# Patient Record
Sex: Female | Born: 1965 | Race: White | Hispanic: No | Smoking: Current every day smoker
Health system: Southern US, Community
[De-identification: ages and names within clinical notes are randomized; demographics above are authoritative.]

## PROBLEM LIST (undated history)

## (undated) DIAGNOSIS — R569 Unspecified convulsions: Secondary | ICD-10-CM

## (undated) DIAGNOSIS — Z8673 Personal history of transient ischemic attack (TIA), and cerebral infarction without residual deficits: Secondary | ICD-10-CM

## (undated) DIAGNOSIS — J449 Chronic obstructive pulmonary disease, unspecified: Secondary | ICD-10-CM

## (undated) DIAGNOSIS — G4733 Obstructive sleep apnea (adult) (pediatric): Secondary | ICD-10-CM

## (undated) DIAGNOSIS — C189 Malignant neoplasm of colon, unspecified: Secondary | ICD-10-CM

## (undated) DIAGNOSIS — C73 Malignant neoplasm of thyroid gland: Secondary | ICD-10-CM

## (undated) DIAGNOSIS — E119 Type 2 diabetes mellitus without complications: Secondary | ICD-10-CM

## (undated) DIAGNOSIS — G510 Bell's palsy: Secondary | ICD-10-CM

## (undated) DIAGNOSIS — G35 Multiple sclerosis: Secondary | ICD-10-CM

## (undated) DIAGNOSIS — Z973 Presence of spectacles and contact lenses: Secondary | ICD-10-CM

## (undated) DIAGNOSIS — I639 Cerebral infarction, unspecified: Secondary | ICD-10-CM

## (undated) DIAGNOSIS — I10 Essential (primary) hypertension: Secondary | ICD-10-CM

## (undated) DIAGNOSIS — F419 Anxiety disorder, unspecified: Secondary | ICD-10-CM

## (undated) DIAGNOSIS — E079 Disorder of thyroid, unspecified: Secondary | ICD-10-CM

## (undated) DIAGNOSIS — Z808 Family history of malignant neoplasm of other organs or systems: Secondary | ICD-10-CM

## (undated) DIAGNOSIS — D689 Coagulation defect, unspecified: Secondary | ICD-10-CM

## (undated) DIAGNOSIS — G709 Myoneural disorder, unspecified: Secondary | ICD-10-CM

## (undated) DIAGNOSIS — C801 Malignant (primary) neoplasm, unspecified: Secondary | ICD-10-CM

## (undated) DIAGNOSIS — M1990 Unspecified osteoarthritis, unspecified site: Secondary | ICD-10-CM

## (undated) DIAGNOSIS — F329 Major depressive disorder, single episode, unspecified: Secondary | ICD-10-CM

## (undated) DIAGNOSIS — K219 Gastro-esophageal reflux disease without esophagitis: Secondary | ICD-10-CM

## (undated) DIAGNOSIS — F32A Depression, unspecified: Secondary | ICD-10-CM

## (undated) DIAGNOSIS — D6801 Von willebrand disease, type 1: Secondary | ICD-10-CM

## (undated) DIAGNOSIS — G4730 Sleep apnea, unspecified: Secondary | ICD-10-CM

## (undated) DIAGNOSIS — R0902 Hypoxemia: Secondary | ICD-10-CM

## (undated) DIAGNOSIS — D680 Von Willebrand's disease: Secondary | ICD-10-CM

## (undated) DIAGNOSIS — J45909 Unspecified asthma, uncomplicated: Secondary | ICD-10-CM

## (undated) DIAGNOSIS — E785 Hyperlipidemia, unspecified: Secondary | ICD-10-CM

## (undated) DIAGNOSIS — Z806 Family history of leukemia: Secondary | ICD-10-CM

## (undated) DIAGNOSIS — Z8501 Personal history of malignant neoplasm of esophagus: Secondary | ICD-10-CM

## (undated) DIAGNOSIS — Z807 Family history of other malignant neoplasms of lymphoid, hematopoietic and related tissues: Secondary | ICD-10-CM

## (undated) HISTORY — PX: COLON SURGERY: SHX602

## (undated) HISTORY — PX: HX APPENDECTOMY: SHX54

## (undated) HISTORY — DX: Obstructive sleep apnea (adult) (pediatric): G47.33

## (undated) HISTORY — DX: Personal history of transient ischemic attack (TIA), and cerebral infarction without residual deficits: Z86.73

## (undated) HISTORY — DX: Malignant neoplasm of thyroid gland (CMS HCC): C73

## (undated) HISTORY — DX: Essential (primary) hypertension: I10

## (undated) HISTORY — DX: Chronic obstructive pulmonary disease, unspecified (CMS HCC): J44.9

## (undated) HISTORY — DX: Type 2 diabetes mellitus without complications (CMS HCC): E11.9

## (undated) HISTORY — PX: HX THYROIDECTOMY: SHX17

## (undated) HISTORY — DX: Unspecified convulsions (CMS HCC): R56.9

## (undated) HISTORY — DX: Chronic obstructive pulmonary disease, unspecified: J44.9

## (undated) HISTORY — DX: Anxiety disorder, unspecified: F41.9

## (undated) HISTORY — DX: Depression, unspecified: F32.A

## (undated) HISTORY — PX: APPENDECTOMY: SHX54

## (undated) HISTORY — DX: Personal history of malignant neoplasm of esophagus: Z85.01

## (undated) HISTORY — DX: Disorder of thyroid, unspecified: E07.9

## (undated) HISTORY — DX: Coagulation defect, unspecified: D68.9

## (undated) HISTORY — DX: Myoneural disorder, unspecified: G70.9

## (undated) HISTORY — PX: OTHER SURGICAL HISTORY: SHX169

## (undated) HISTORY — PX: HERNIA REPAIR: SHX51

## (undated) HISTORY — DX: Unspecified osteoarthritis, unspecified site: M19.90

## (undated) HISTORY — DX: Unspecified asthma, uncomplicated: J45.909

## (undated) HISTORY — DX: Hypoxemia: R09.02

## (undated) HISTORY — DX: Sleep apnea, unspecified: G47.30

## (undated) HISTORY — DX: Family history of malignant neoplasm of other organs or systems: Z80.8

## (undated) HISTORY — DX: Malignant (primary) neoplasm, unspecified: C80.1

## (undated) HISTORY — DX: Hyperlipidemia, unspecified: E78.5

## (undated) HISTORY — DX: Type 2 diabetes mellitus without complications: E11.9

## (undated) HISTORY — DX: Unspecified convulsions: R56.9

## (undated) HISTORY — DX: Family history of leukemia: Z80.6

## (undated) HISTORY — DX: Gastro-esophageal reflux disease without esophagitis: K21.9

## (undated) HISTORY — PX: ABDOMINAL HYSTERECTOMY: SHX81

## (undated) HISTORY — DX: Family history of other malignant neoplasms of lymphoid, hematopoietic and related tissues: Z80.7

## (undated) HISTORY — PX: KNEE SURGERY: SHX244

---
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
## 2002-02-16 DIAGNOSIS — C181 Malignant neoplasm of appendix: Secondary | ICD-10-CM

## 2002-02-16 HISTORY — DX: Malignant neoplasm of appendix: C18.1

## 2007-02-17 DIAGNOSIS — C189 Malignant neoplasm of colon, unspecified: Secondary | ICD-10-CM

## 2007-02-17 HISTORY — DX: Malignant neoplasm of colon, unspecified: C18.9

## 2012-02-17 DIAGNOSIS — C73 Malignant neoplasm of thyroid gland: Secondary | ICD-10-CM

## 2012-02-17 HISTORY — DX: Malignant neoplasm of thyroid gland: C73

## 2017-07-21 DIAGNOSIS — L309 Dermatitis, unspecified: Secondary | ICD-10-CM | POA: Insufficient documentation

## 2017-07-21 DIAGNOSIS — Z9049 Acquired absence of other specified parts of digestive tract: Secondary | ICD-10-CM | POA: Insufficient documentation

## 2017-07-21 DIAGNOSIS — M940 Chondrocostal junction syndrome [Tietze]: Secondary | ICD-10-CM | POA: Insufficient documentation

## 2017-07-21 DIAGNOSIS — E890 Postprocedural hypothyroidism: Secondary | ICD-10-CM | POA: Insufficient documentation

## 2017-07-21 DIAGNOSIS — Z85038 Personal history of other malignant neoplasm of large intestine: Secondary | ICD-10-CM | POA: Insufficient documentation

## 2017-07-21 DIAGNOSIS — D6800 Von Willebrand disease, unspecified: Secondary | ICD-10-CM | POA: Insufficient documentation

## 2017-07-21 DIAGNOSIS — G35 Multiple sclerosis: Secondary | ICD-10-CM | POA: Insufficient documentation

## 2017-07-21 DIAGNOSIS — Z85850 Personal history of malignant neoplasm of thyroid: Secondary | ICD-10-CM | POA: Insufficient documentation

## 2017-07-21 DIAGNOSIS — F339 Major depressive disorder, recurrent, unspecified: Secondary | ICD-10-CM | POA: Insufficient documentation

## 2017-07-21 DIAGNOSIS — G8929 Other chronic pain: Secondary | ICD-10-CM | POA: Insufficient documentation

## 2017-07-21 DIAGNOSIS — J449 Chronic obstructive pulmonary disease, unspecified: Secondary | ICD-10-CM | POA: Insufficient documentation

## 2017-09-21 DIAGNOSIS — E890 Postprocedural hypothyroidism: Secondary | ICD-10-CM | POA: Diagnosis not present

## 2017-09-21 DIAGNOSIS — H66001 Acute suppurative otitis media without spontaneous rupture of ear drum, right ear: Secondary | ICD-10-CM | POA: Diagnosis not present

## 2017-09-21 DIAGNOSIS — I10 Essential (primary) hypertension: Secondary | ICD-10-CM | POA: Diagnosis not present

## 2017-10-06 DIAGNOSIS — G35 Multiple sclerosis: Secondary | ICD-10-CM | POA: Diagnosis not present

## 2017-11-17 DIAGNOSIS — E109 Type 1 diabetes mellitus without complications: Secondary | ICD-10-CM | POA: Diagnosis not present

## 2017-12-28 DIAGNOSIS — E890 Postprocedural hypothyroidism: Secondary | ICD-10-CM | POA: Diagnosis not present

## 2017-12-28 DIAGNOSIS — I10 Essential (primary) hypertension: Secondary | ICD-10-CM | POA: Diagnosis not present

## 2017-12-28 DIAGNOSIS — E109 Type 1 diabetes mellitus without complications: Secondary | ICD-10-CM | POA: Diagnosis not present

## 2018-03-02 DIAGNOSIS — I10 Essential (primary) hypertension: Secondary | ICD-10-CM | POA: Diagnosis not present

## 2018-03-02 DIAGNOSIS — R5383 Other fatigue: Secondary | ICD-10-CM | POA: Diagnosis not present

## 2018-03-02 DIAGNOSIS — K047 Periapical abscess without sinus: Secondary | ICD-10-CM | POA: Diagnosis not present

## 2018-03-02 DIAGNOSIS — R140 Abdominal distension (gaseous): Secondary | ICD-10-CM | POA: Diagnosis not present

## 2018-03-02 DIAGNOSIS — R1011 Right upper quadrant pain: Secondary | ICD-10-CM | POA: Diagnosis not present

## 2018-03-02 DIAGNOSIS — Z85038 Personal history of other malignant neoplasm of large intestine: Secondary | ICD-10-CM | POA: Diagnosis not present

## 2018-03-02 DIAGNOSIS — E890 Postprocedural hypothyroidism: Secondary | ICD-10-CM | POA: Diagnosis not present

## 2018-03-02 DIAGNOSIS — N6002 Solitary cyst of left breast: Secondary | ICD-10-CM | POA: Diagnosis not present

## 2018-03-02 DIAGNOSIS — E109 Type 1 diabetes mellitus without complications: Secondary | ICD-10-CM | POA: Diagnosis not present

## 2018-03-28 DIAGNOSIS — R140 Abdominal distension (gaseous): Secondary | ICD-10-CM | POA: Diagnosis not present

## 2018-03-28 DIAGNOSIS — R1011 Right upper quadrant pain: Secondary | ICD-10-CM | POA: Diagnosis not present

## 2018-03-28 DIAGNOSIS — Z85038 Personal history of other malignant neoplasm of large intestine: Secondary | ICD-10-CM | POA: Diagnosis not present

## 2018-03-31 DIAGNOSIS — N6002 Solitary cyst of left breast: Secondary | ICD-10-CM | POA: Diagnosis not present

## 2018-04-04 DIAGNOSIS — N6342 Unspecified lump in left breast, subareolar: Secondary | ICD-10-CM | POA: Diagnosis not present

## 2018-04-04 DIAGNOSIS — N6002 Solitary cyst of left breast: Secondary | ICD-10-CM | POA: Diagnosis not present

## 2018-04-15 DIAGNOSIS — E109 Type 1 diabetes mellitus without complications: Secondary | ICD-10-CM | POA: Diagnosis not present

## 2018-04-22 DIAGNOSIS — H524 Presbyopia: Secondary | ICD-10-CM | POA: Diagnosis not present

## 2018-04-22 DIAGNOSIS — H538 Other visual disturbances: Secondary | ICD-10-CM | POA: Diagnosis not present

## 2018-04-26 DIAGNOSIS — R1011 Right upper quadrant pain: Secondary | ICD-10-CM | POA: Diagnosis not present

## 2018-04-26 DIAGNOSIS — J438 Other emphysema: Secondary | ICD-10-CM | POA: Diagnosis not present

## 2018-05-02 DIAGNOSIS — R1011 Right upper quadrant pain: Secondary | ICD-10-CM | POA: Diagnosis not present

## 2018-05-02 DIAGNOSIS — K219 Gastro-esophageal reflux disease without esophagitis: Secondary | ICD-10-CM | POA: Diagnosis not present

## 2018-05-02 DIAGNOSIS — D680 Von Willebrand's disease: Secondary | ICD-10-CM | POA: Diagnosis not present

## 2018-05-02 DIAGNOSIS — R9389 Abnormal findings on diagnostic imaging of other specified body structures: Secondary | ICD-10-CM | POA: Diagnosis not present

## 2018-05-02 DIAGNOSIS — J449 Chronic obstructive pulmonary disease, unspecified: Secondary | ICD-10-CM | POA: Diagnosis not present

## 2018-05-02 DIAGNOSIS — R197 Diarrhea, unspecified: Secondary | ICD-10-CM | POA: Diagnosis not present

## 2018-05-02 DIAGNOSIS — Z85038 Personal history of other malignant neoplasm of large intestine: Secondary | ICD-10-CM | POA: Diagnosis not present

## 2018-05-02 DIAGNOSIS — R748 Abnormal levels of other serum enzymes: Secondary | ICD-10-CM | POA: Diagnosis not present

## 2018-05-17 DIAGNOSIS — J449 Chronic obstructive pulmonary disease, unspecified: Secondary | ICD-10-CM | POA: Diagnosis not present

## 2018-06-01 DIAGNOSIS — D680 Von Willebrand's disease: Secondary | ICD-10-CM | POA: Diagnosis not present

## 2018-06-01 DIAGNOSIS — Z85850 Personal history of malignant neoplasm of thyroid: Secondary | ICD-10-CM | POA: Diagnosis not present

## 2018-06-01 DIAGNOSIS — Z85038 Personal history of other malignant neoplasm of large intestine: Secondary | ICD-10-CM | POA: Diagnosis not present

## 2018-10-19 DIAGNOSIS — I10 Essential (primary) hypertension: Secondary | ICD-10-CM | POA: Diagnosis not present

## 2018-10-19 DIAGNOSIS — E109 Type 1 diabetes mellitus without complications: Secondary | ICD-10-CM | POA: Diagnosis not present

## 2018-10-19 DIAGNOSIS — G478 Other sleep disorders: Secondary | ICD-10-CM | POA: Diagnosis not present

## 2018-10-19 DIAGNOSIS — F329 Major depressive disorder, single episode, unspecified: Secondary | ICD-10-CM | POA: Diagnosis not present

## 2018-10-19 DIAGNOSIS — E782 Mixed hyperlipidemia: Secondary | ICD-10-CM | POA: Diagnosis not present

## 2018-10-19 DIAGNOSIS — E890 Postprocedural hypothyroidism: Secondary | ICD-10-CM | POA: Diagnosis not present

## 2019-09-01 ENCOUNTER — Encounter: Payer: Self-pay | Admitting: Nurse Practitioner

## 2019-09-01 ENCOUNTER — Other Ambulatory Visit: Payer: Self-pay

## 2019-09-01 ENCOUNTER — Ambulatory Visit (INDEPENDENT_AMBULATORY_CARE_PROVIDER_SITE_OTHER): Payer: Medicaid Other | Admitting: Nurse Practitioner

## 2019-09-01 VITALS — BP 138/88 | HR 87 | Temp 98.7°F | Ht 65.5 in

## 2019-09-01 DIAGNOSIS — E1165 Type 2 diabetes mellitus with hyperglycemia: Secondary | ICD-10-CM | POA: Insufficient documentation

## 2019-09-01 DIAGNOSIS — E1169 Type 2 diabetes mellitus with other specified complication: Secondary | ICD-10-CM | POA: Diagnosis not present

## 2019-09-01 DIAGNOSIS — Z79899 Other long term (current) drug therapy: Secondary | ICD-10-CM | POA: Diagnosis not present

## 2019-09-01 DIAGNOSIS — F339 Major depressive disorder, recurrent, unspecified: Secondary | ICD-10-CM

## 2019-09-01 DIAGNOSIS — E1069 Type 1 diabetes mellitus with other specified complication: Secondary | ICD-10-CM

## 2019-09-01 DIAGNOSIS — E039 Hypothyroidism, unspecified: Secondary | ICD-10-CM | POA: Insufficient documentation

## 2019-09-01 DIAGNOSIS — I10 Essential (primary) hypertension: Secondary | ICD-10-CM

## 2019-09-01 DIAGNOSIS — G35D Multiple sclerosis, unspecified: Secondary | ICD-10-CM

## 2019-09-01 DIAGNOSIS — J449 Chronic obstructive pulmonary disease, unspecified: Secondary | ICD-10-CM | POA: Diagnosis not present

## 2019-09-01 DIAGNOSIS — K047 Periapical abscess without sinus: Secondary | ICD-10-CM

## 2019-09-01 DIAGNOSIS — E109 Type 1 diabetes mellitus without complications: Secondary | ICD-10-CM

## 2019-09-01 DIAGNOSIS — E890 Postprocedural hypothyroidism: Secondary | ICD-10-CM

## 2019-09-01 DIAGNOSIS — G35 Multiple sclerosis: Secondary | ICD-10-CM | POA: Diagnosis not present

## 2019-09-01 DIAGNOSIS — K089 Disorder of teeth and supporting structures, unspecified: Secondary | ICD-10-CM

## 2019-09-01 DIAGNOSIS — Z85850 Personal history of malignant neoplasm of thyroid: Secondary | ICD-10-CM

## 2019-09-01 DIAGNOSIS — F17219 Nicotine dependence, cigarettes, with unspecified nicotine-induced disorders: Secondary | ICD-10-CM | POA: Insufficient documentation

## 2019-09-01 DIAGNOSIS — Z85038 Personal history of other malignant neoplasm of large intestine: Secondary | ICD-10-CM | POA: Diagnosis not present

## 2019-09-01 DIAGNOSIS — E559 Vitamin D deficiency, unspecified: Secondary | ICD-10-CM

## 2019-09-01 DIAGNOSIS — E785 Hyperlipidemia, unspecified: Secondary | ICD-10-CM | POA: Diagnosis not present

## 2019-09-01 DIAGNOSIS — Z8659 Personal history of other mental and behavioral disorders: Secondary | ICD-10-CM

## 2019-09-01 DIAGNOSIS — D6800 Von Willebrand disease, unspecified: Secondary | ICD-10-CM

## 2019-09-01 DIAGNOSIS — Z7689 Persons encountering health services in other specified circumstances: Secondary | ICD-10-CM

## 2019-09-01 DIAGNOSIS — E1159 Type 2 diabetes mellitus with other circulatory complications: Secondary | ICD-10-CM | POA: Insufficient documentation

## 2019-09-01 DIAGNOSIS — G40909 Epilepsy, unspecified, not intractable, without status epilepticus: Secondary | ICD-10-CM

## 2019-09-01 DIAGNOSIS — E538 Deficiency of other specified B group vitamins: Secondary | ICD-10-CM

## 2019-09-01 DIAGNOSIS — E669 Obesity, unspecified: Secondary | ICD-10-CM

## 2019-09-01 DIAGNOSIS — I152 Hypertension secondary to endocrine disorders: Secondary | ICD-10-CM

## 2019-09-01 LAB — BAYER DCA HB A1C WAIVED: HB A1C (BAYER DCA - WAIVED): 12.9 % — ABNORMAL HIGH (ref <7.0)

## 2019-09-01 LAB — MICROALBUMIN, URINE WAIVED
Creatinine, Urine Waived: 10 mg/dL (ref 10–300)
Microalb, Ur Waived: 10 mg/L (ref 0–19)

## 2019-09-01 MED ORDER — TIZANIDINE HCL 4 MG PO TABS: 4.0000 mg | ORAL_TABLET | Freq: Three times a day (TID) | ORAL | 4 refills | Status: DC | PRN

## 2019-09-01 MED ORDER — ATORVASTATIN CALCIUM 40 MG PO TABS: 40.0000 mg | ORAL_TABLET | Freq: Every day | ORAL | 4 refills | Status: DC

## 2019-09-01 MED ORDER — HYDROXYZINE HCL 25 MG PO TABS: 25.0000 mg | ORAL_TABLET | Freq: Three times a day (TID) | ORAL | 4 refills | Status: DC

## 2019-09-01 MED ORDER — ALBUTEROL SULFATE HFA 108 (90 BASE) MCG/ACT IN AERS: 2.0000 | INHALATION_SPRAY | Freq: Four times a day (QID) | RESPIRATORY_TRACT | 4 refills | Status: AC | PRN

## 2019-09-01 MED ORDER — TOPIRAMATE 100 MG PO TABS: 100.0000 mg | ORAL_TABLET | Freq: Two times a day (BID) | ORAL | 4 refills | Status: DC

## 2019-09-01 MED ORDER — AMOXICILLIN-POT CLAVULANATE 875-125 MG PO TABS: 1.0000 | ORAL_TABLET | Freq: Two times a day (BID) | ORAL | 0 refills | Status: AC

## 2019-09-01 MED ORDER — AMLODIPINE BESYLATE 5 MG PO TABS: 5.0000 mg | ORAL_TABLET | Freq: Every day | ORAL | 4 refills | Status: DC

## 2019-09-01 MED ORDER — INSULIN GLARGINE 100 UNIT/ML SOLOSTAR PEN: 42.0000 [IU] | PEN_INJECTOR | Freq: Every day | SUBCUTANEOUS | 11 refills | Status: DC

## 2019-09-01 MED ORDER — FLUTICASONE-SALMETEROL 250-50 MCG/DOSE IN AEPB: 1.0000 | INHALATION_SPRAY | Freq: Every day | RESPIRATORY_TRACT | 4 refills | Status: DC

## 2019-09-01 MED ORDER — LEVOTHYROXINE SODIUM 200 MCG PO TABS: 200.0000 ug | ORAL_TABLET | Freq: Every day | ORAL | 4 refills | Status: DC

## 2019-09-01 MED ORDER — ATENOLOL 50 MG PO TABS: 50.0000 mg | ORAL_TABLET | Freq: Every day | ORAL | 4 refills | Status: DC

## 2019-09-01 MED ORDER — DULOXETINE HCL 30 MG PO CPEP: 30.0000 mg | ORAL_CAPSULE | Freq: Two times a day (BID) | ORAL | 4 refills | Status: DC

## 2019-09-01 MED ORDER — BLOOD GLUCOSE MONITOR KIT: PACK | 0 refills | Status: DC

## 2019-09-01 MED ORDER — INSULIN LISPRO (1 UNIT DIAL) 100 UNIT/ML (KWIKPEN): PEN_INJECTOR | SUBCUTANEOUS | 11 refills | Status: DC

## 2019-09-01 NOTE — Assessment & Plan Note (Signed)
Referral to GI per patient request -- no records available from [place] and request she sign release of records so these can be reviewed.

## 2019-09-01 NOTE — Assessment & Plan Note (Signed)
Ongoing due to reported history of thyroid CA, will obtain TSH and Free T4 today as recent TSH were on lower side.  Continue current medication regimen and adjust as needed.  Refills sent on this.  Return in 4 weeks.

## 2019-09-01 NOTE — Patient Instructions (Signed)

## 2019-09-01 NOTE — Assessment & Plan Note (Addendum)
Chronic, ongoing.  Continue Duloxetine and Hydroxyzine, refills sent.  No refills sent on Valium -- discussed with her practice policy on controlled substances and obtained UDS today.  There is no history of this being prescribed recently on PDMP review, which is concerning, and she did have + UDS in September 2020 for cocaine.  Will further discuss with her next visit, as she had been getting fills on Adderall for her MS in [place]. At this time will recommend continuing Duloxetine and Hydroxyzine only, if wishes further treatment will plan on referral to psychiatry which would be beneficial in this complex case, ? personality disorder.  Return in 4 weeks.

## 2019-09-01 NOTE — Assessment & Plan Note (Signed)
Referral to hem/onc and endocrinology per her request. Have no records from [place] to review and requested she sign release of records so these can be reviewed.

## 2019-09-01 NOTE — Assessment & Plan Note (Signed)
Reports Valium use for anxiety. There is no history of this being prescribed recently on PDMP review, which is concerning, and she did have + UDS in September 2020 for cocaine.  Will further discuss with her next visit, as she had been getting fills on Adderall for her MS in [place]. UDS obtained today.  At this time will recommend continuing Duloxetine and Hydroxyzine only, if wishes further treatment will plan on referral to psychiatry which would be beneficial in this complex case.  Return in 4 weeks.

## 2019-09-01 NOTE — Assessment & Plan Note (Signed)
Chronic, ongoing.  Continue current medication regimen and adjust as needed.  Refills sent.  Obtain lipid panel and CMP today.

## 2019-09-01 NOTE — Telephone Encounter (Signed)
Pt needs an RX for Adderall 12.5 mg 2 times a day called in with the rest of the medications you are sending in from today's visit.

## 2019-09-01 NOTE — Assessment & Plan Note (Signed)
Ongoing per patient reported history, no records from [place] to review.  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  Will place referral to hematology for further work-up, as uncertain of history and have recommend she sign a release of records to further review.

## 2019-09-01 NOTE — Assessment & Plan Note (Signed)
Referral to dental 

## 2019-09-01 NOTE — Assessment & Plan Note (Signed)
Refuses weight due to this history, denies current symptoms.  Monitor closely and consider psychiatry referral in future.

## 2019-09-01 NOTE — Assessment & Plan Note (Signed)
Chronic, ongoing.  Initial BP elevated, but repeat improving.  Has been out of medication.  Refills sent.  Continue current regimen and adjust as needed.  Recommend she monitor BP three days a week at home and document for provider + focus on DASH diet.  CMP and CBC today.  Return in 4 weeks.

## 2019-09-01 NOTE — Assessment & Plan Note (Addendum)
Chronic and ongoing.  A1C 12.9% today and urine ALB 10.  Referral to endocrinology placed.  Recommend she increase Lantus to 42 units and continue sliding scale Humalog.  Refills sent.  Script for glucometer sent, would benefit from [facility].  CCM and palliative referrals in place and referral to ophthalmology for eye exam.  Recommend she continue to monitor BS frequently and document for provider + bring to visits.  Focus on diabetic diet.  Return in 4 weeks.

## 2019-09-01 NOTE — Assessment & Plan Note (Addendum)
Acute, will treat with Augmentin, script sent.  Referral to dental placed and recommend patient attend appointment.  May use Tylenol as needed at home for pain.  Be seen immediately if worsening symptoms.

## 2019-09-01 NOTE — Assessment & Plan Note (Addendum)
Ongoing per patient report.  Will place referral to neurology for further guidance and work-up due to uncertain history.  She reports use of Adderall, this is noted on PDMP review and was prescribed in [facility], but no records available from [place] to review on this and no scripts noted from [place] on [place] review.  No refills sent on Adderall -- discussed with her practice policy on controlled substances and obtained UDS today.  She did have + UDS in September 2020 for cocaine, which is concerning and will be discussed with her next visit.  Return in 4 weeks.
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
## 2019-09-01 NOTE — Assessment & Plan Note (Signed)
Chronic, ongoing per patient report.  Continue current inhaler regimen and adjust as needed.  Plan on spirometry next visit.  Recommend complete cessation of smoking.  Consider lung CT CA screening in future.  Spirometry next visit.  Return in 4 weeks.

## 2019-09-02 LAB — COMPREHENSIVE METABOLIC PANEL WITH GFR
ALT: 23 [IU]/L (ref 0–32)
AST: 15 [IU]/L (ref 0–40)
Albumin/Globulin Ratio: 1.7 (ref 1.2–2.2)
Albumin: 4.8 g/dL (ref 3.8–4.9)
Alkaline Phosphatase: 188 [IU]/L — ABNORMAL HIGH (ref 48–121)
BUN/Creatinine Ratio: 13 (ref 9–23)
BUN: 11 mg/dL (ref 6–24)
Bilirubin Total: 0.6 mg/dL (ref 0.0–1.2)
CO2: 22 mmol/L (ref 20–29)
Calcium: 9.7 mg/dL (ref 8.7–10.2)
Chloride: 93 mmol/L — ABNORMAL LOW (ref 96–106)
Creatinine, Ser: 0.82 mg/dL (ref 0.57–1.00)
GFR calc Af Amer: 93 mL/min/{1.73_m2}
GFR calc non Af Amer: 81 mL/min/{1.73_m2}
Globulin, Total: 2.9 g/dL (ref 1.5–4.5)
Glucose: 367 mg/dL — ABNORMAL HIGH (ref 65–99)
Potassium: 4.3 mmol/L (ref 3.5–5.2)
Sodium: 132 mmol/L — ABNORMAL LOW (ref 134–144)
Total Protein: 7.7 g/dL (ref 6.0–8.5)

## 2019-09-02 LAB — VITAMIN B12: Vitamin B-12: 648 pg/mL (ref 232–1245)

## 2019-09-02 LAB — CBC WITH DIFFERENTIAL/PLATELET
Basophils Absolute: 0 10*3/uL (ref 0.0–0.2)
Basos: 1 %
EOS (ABSOLUTE): 0.3 10*3/uL (ref 0.0–0.4)
Eos: 4 %
Hematocrit: 52.7 % — ABNORMAL HIGH (ref 34.0–46.6)
Hemoglobin: 17.1 g/dL — ABNORMAL HIGH (ref 11.1–15.9)
Immature Grans (Abs): 0 10*3/uL (ref 0.0–0.1)
Immature Granulocytes: 0 %
Lymphocytes Absolute: 2.1 10*3/uL (ref 0.7–3.1)
Lymphs: 26 %
MCH: 28.1 pg (ref 26.6–33.0)
MCHC: 32.4 g/dL (ref 31.5–35.7)
MCV: 87 fL (ref 79–97)
Monocytes Absolute: 0.5 10*3/uL (ref 0.1–0.9)
Monocytes: 6 %
Neutrophils Absolute: 5.3 10*3/uL (ref 1.4–7.0)
Neutrophils: 63 %
Platelets: 259 10*3/uL (ref 150–450)
RBC: 6.09 x10E6/uL — ABNORMAL HIGH (ref 3.77–5.28)
RDW: 13 % (ref 11.7–15.4)
WBC: 8.3 10*3/uL (ref 3.4–10.8)

## 2019-09-02 LAB — LIPID PANEL W/O CHOL/HDL RATIO
Cholesterol, Total: 237 mg/dL — ABNORMAL HIGH (ref 100–199)
HDL: 35 mg/dL — ABNORMAL LOW
LDL Chol Calc (NIH): 164 mg/dL — ABNORMAL HIGH (ref 0–99)
Triglycerides: 202 mg/dL — ABNORMAL HIGH (ref 0–149)
VLDL Cholesterol Cal: 38 mg/dL (ref 5–40)

## 2019-09-02 LAB — T4, FREE: Free T4: 1.88 ng/dL — ABNORMAL HIGH (ref 0.82–1.77)

## 2019-09-02 LAB — VITAMIN D 25 HYDROXY (VIT D DEFICIENCY, FRACTURES): Vit D, 25-Hydroxy: 15.7 ng/mL — ABNORMAL LOW (ref 30.0–100.0)

## 2019-09-02 LAB — TSH: TSH: 0.859 u[IU]/mL (ref 0.450–4.500)

## 2019-09-03 ENCOUNTER — Other Ambulatory Visit: Payer: Self-pay | Admitting: Nurse Practitioner

## 2019-09-03 MED ORDER — CHOLECALCIFEROL 1.25 MG (50000 UT) PO TABS: 1.0000 | ORAL_TABLET | ORAL | 0 refills | Status: DC

## 2019-09-03 NOTE — Progress Notes (Signed)
Contacted via [place] evening [place], your labs have returned: - Glucose is elevated as expected.  Sodium a little low. Focus on diet changes and continue insulin as we discussed.  For sodium, add a little salt to diet and we will recheck this next visit.  Your sugars being elevated can push sodium down. - Cholesterol levels are elevated -- continue taking Atorvastatin every day and I would like to recheck this fasting next visit. -Thyroid in good range at this time, continue current Levothyroxine dose. - B12 level is normal. - Vitamin D level is low, I am going to send in a higher dose vitamin d for you to take weekly for 8 weeks and then we can move to every day dosing of over the counter vitamin d3 if levels improved on labs.   Any questions?

## 2019-09-04 NOTE — Addendum Note (Signed)
Addended by: Marnee Guarneri T on: 09/04/2019 02:58 PM   Modules accepted: Orders

## 2019-09-05 ENCOUNTER — Telehealth: Payer: Self-pay | Admitting: Adult Health Nurse Practitioner

## 2019-09-06 ENCOUNTER — Encounter: Payer: Self-pay | Admitting: Nurse Practitioner

## 2019-09-06 ENCOUNTER — Ambulatory Visit (INDEPENDENT_AMBULATORY_CARE_PROVIDER_SITE_OTHER): Payer: Medicaid Other | Admitting: Nurse Practitioner

## 2019-09-06 ENCOUNTER — Other Ambulatory Visit: Payer: Self-pay

## 2019-09-06 VITALS — BP 127/82 | HR 76 | Temp 98.5°F

## 2019-09-06 DIAGNOSIS — G35 Multiple sclerosis: Secondary | ICD-10-CM | POA: Diagnosis not present

## 2019-09-06 DIAGNOSIS — F339 Major depressive disorder, recurrent, unspecified: Secondary | ICD-10-CM | POA: Diagnosis not present

## 2019-09-06 MED ORDER — ATORVASTATIN CALCIUM 40 MG PO TABS: 40.0000 mg | ORAL_TABLET | Freq: Every day | ORAL | 4 refills | Status: DC

## 2019-09-06 NOTE — Assessment & Plan Note (Addendum)
Chronic, ongoing.  Continue Duloxetine and Hydroxyzine.  No refills sent on Valium -- discussed with her practice policy on controlled substances and that UDS not resulted as of yet + reviewed her past PDMP review noting no Valium fills and recent September drug screen being positive for cocaine.  Discussed that Valium would not be filled unless she can provide previous [place] provider notes and has negative UDS, she reports understanding.  At this time will recommend continuing Duloxetine and Hydroxyzine only, if wishes further treatment will plan on referral to psychiatry which would be beneficial in this complex case.  Return in 4 weeks.

## 2019-09-06 NOTE — Telephone Encounter (Signed)
Rec'd voicemail from patient, returned her call and discussed Palliative services with her and she was in agreement with this.  I have scheduled an In--person Consult for 09/12/19 @ 9 AM

## 2019-09-06 NOTE — Assessment & Plan Note (Addendum)
Ongoing per patient report.  Has referral into neurology to further assess this history.   She reports use of Adderall, this is noted on PDMP review and was prescribed in [facility], but no records available from [place] to review on this and no scripts noted from [place] on [place] review.  Discussed with her practice policy on controlled substances and that UDS not resulted as of yet + reviewed her past PDMP review noting no Adderall prior to [facility] and recent September drug screen being positive for cocaine.  Discussed that Adderall would not be filled unless she can provide previous [place] provider notes and has negative UDS, she reports understanding.  She did have + UDS in September 2020 for cocaine, which is concerning and discussed with her.  Return in 4 weeks.
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
## 2019-09-06 NOTE — Telephone Encounter (Signed)
Called patient to offer to schedule a Palliative Consult, no answer - left message with reason for call along with my name and contact number 

## 2019-09-06 NOTE — Patient Instructions (Signed)

## 2019-09-10 NOTE — Chronic Care Management (AMB) (Unsigned)
Chronic Care Management Pharmacy  Name: Margaret Church  MRN: [id number] DOB: 15-Nov-1965   Chief Complaint/ HPI  Margaret Church,  54 y.o. , female presents for their Initial CCM visit with the clinical pharmacist via telephone due to COVID-19 Pandemic.  PCP : Venita Lick, NP Patient Care Team: Venita Lick, NP as PCP - General (Nurse Practitioner) Vladimir Faster, [facility] as Pharmacist (Pharmacist)  Their chronic conditions include: Hypertension, Diabetes, COPD, Hypothyroidism, Depression, Tobacco use and Chronic pain and multiple sclerosis   Office Visits: 09/06/19-Margaret Ned Card, NP- continue duloxetine, hydroxyzine. Request records from  [place] for Adderall and diazepam. 09/01/19- Margaret Guarneri, NP - initial visit. Blood work. Augmentin - dental abscess rt lower-dental referral, Start Vit D 50,000 u q week, referral to hem/onc- [place], referral to neuro- [place] d/o, referral pulmonology- COPD, referral to endocrinology and increase Lantus dose, urine drug screen  Consult Visit:  09/11/19 - Dr. Rogue Bussing, Hem/onc- bloodwork 09/12/19- Margaret Ben, NP, Palliative Care Consult  Allergies  Allergen Reactions  . Latex     Other reaction(s): Hive  . Other Other (See Comments)    SURGICAL STAPLES CAUSE LOCAL SWELLING  . Tetanus Antitoxin     Other reaction(s): Arm Swelling, Fever, N/V  . Gabapentin Rash    Other reaction(s): Pins and Needles Sensation  . Tramadol Rash    Medications: Outpatient Encounter Medications as of 09/11/2019  Medication Sig  . albuterol (VENTOLIN HFA) 108 (90 Base) MCG/ACT inhaler Inhale 2 puffs into the lungs every 6 (six) hours as needed for wheezing or shortness of breath.  Marland Kitchen amLODipine (NORVASC) 5 MG tablet Take 1 tablet (5 mg total) by mouth daily.  Marland Kitchen amphetamine-dextroamphetamine (ADDERALL) 12.5 MG tablet Take 12.5 mg by mouth 2 (two) times daily.  Marland Kitchen atenolol (TENORMIN) 50 MG tablet Take 1 tablet (50 mg total) by mouth  daily.  Marland Kitchen atorvastatin (LIPITOR) 40 MG tablet Take 1 tablet (40 mg total) by mouth at bedtime.  . baclofen (LIORESAL) 20 MG tablet Take 1 tablet by mouth 2 (two) times daily.  . blood glucose meter kit and supplies KIT To check blood sugar 4 times a day.  . Cholecalciferol 1.25 MG (50000 UT) TABS Take 1 tablet by mouth once a week. For 8 weeks and then stop.  Return to office for lab draw.  . diazepam (VALIUM) 5 MG tablet Take 5 mg by mouth every 6 (six) hours as needed for anxiety.  . DULoxetine (CYMBALTA) 30 MG capsule Take 1 capsule (30 mg total) by mouth 2 (two) times daily.  . Fluticasone-Salmeterol (ADVAIR) 250-50 MCG/DOSE AEPB Inhale 1 puff into the lungs daily.  . hydrOXYzine (ATARAX/VISTARIL) 25 MG tablet Take 1 tablet (25 mg total) by mouth 3 (three) times daily.  . insulin glargine (LANTUS) 100 UNIT/ML Solostar Pen Inject 42 Units into the skin at bedtime.  . insulin lispro (HUMALOG KWIKPEN) 100 UNIT/ML KwikPen Inject 4 to 18 units subcutaneously up to 3-4 times a day.  . ipratropium-albuterol (DUONEB) 0.5-2.5 (3) MG/3ML SOLN Inhale into the lungs as needed.  Marland Kitchen levothyroxine (SYNTHROID) 200 MCG tablet Take 1 tablet (200 mcg total) by mouth daily. With 25 mcg. (total 225 mcg daily)  . levothyroxine (SYNTHROID) 25 MCG tablet Take 25 mcg by mouth daily before breakfast. Take in addition to the 200 mg  . tiZANidine (ZANAFLEX) 4 MG tablet Take 1 tablet (4 mg total) by mouth 3 (three) times daily as needed.  . topiramate (TOPAMAX) 100 MG tablet Take 1 tablet (  100 mg total) by mouth 2 (two) times daily.   No facility-administered encounter medications on file as of 09/11/2019.     Current Diagnosis/Assessment:    Goals Addressed   None     COPD / Asthma / Tobacco   Last spirometry score: ***  Gold Grade: {CHL HP Upstream Pharm COPD Gold HYQMV:7846962952} Current COPD Classification:  {CHL HP Upstream Pharm COPD Classification:[phone]}  Eosinophil count:  No results found  for: EOSPCT%                               Eos (Absolute):  Lab Results  Component Value Date/Time   EOSABS 0.3 09/01/2019 11:04 AM    Tobacco Status:  Social History   Tobacco Use  Smoking Status Current Every Day Smoker  . Packs/day: 0.25  . Types: Cigarettes  Smokeless Tobacco Never Used    Patient has failed these meds in past: Not available Patient is currently {CHL Controlled/Uncontrolled:[phone]} on the following medications:   Albuterol 2 puffs q6h prn  Advair 250/50 mcg 1 puff daily  duoneb prn Using maintenance inhaler regularly? {yes/no:20286} Frequency of rescue inhaler use:  {CHL HP Upstream Pharm Inhaler WUXL:2440102725}  We discussed:  smoking cessation and proper inhaler technique  Plan  Continue {CHL HP Upstream Pharmacy Plans:[phone]}  and  Other Diagnosis: Multiple sclerosis    Patient has failed these meds in past: *** Patient is currently {CHL Controlled/Uncontrolled:[phone]} on the following medications:   Adderall 12.5 mg qd?  Baclofen 20 mg bid?  Tizanidine 4 mg tid prn  We discussed:  {CHL HP Upstream Pharmacy discussion:[id number]}  Plan  Continue {CHL HP Upstream Pharmacy Plans:[phone]}  Hypertension   BP goal is:  {CHL HP UPSTREAM Pharmacist BP ranges:[phone]}  Office blood pressures are  BP Readings from Last 3 Encounters:  09/06/19 127/82  09/01/19 138/88   Patient checks BP at home {CHL HP BP Monitoring Frequency:[phone]} Patient home BP readings are ranging: ***  Patient has failed these meds in the past: Not available Patient is currently {CHL Controlled/Uncontrolled:[phone]} on the following medications:   Amlodipine 5 mg qd  Atenolol 50 mg qd  We discussed taking atenolol at night for nocturnal BP coverage  Plan  Continue current medications  Hyperlipidemia?   LDL goal < 70  Lipid Panel     Component Value Date/Time   CHOL 237 (H) 09/01/2019 1104   TRIG 202 (H) 09/01/2019 1104     HDL 35 (L) 09/01/2019 1104   LDLCALC 164 (H) 09/01/2019 1104    Hepatic Function Latest Ref Rng & Units 09/01/2019  Total Protein 6.0 - 8.5 g/dL 7.7  Albumin 3.8 - 4.9 g/dL 4.8  AST 0 - 40 IU/L 15  ALT 0 - 32 IU/L 23  Alk Phosphatase 48 - 121 IU/L 188(H)  Total Bilirubin 0.0 - 1.2 mg/dL 0.6     The 10-year ASCVD risk score Mikey Bussing DC Jr., et al., 2013) is: 21%   Values used to calculate the score:     Age: 30 years     Sex: Female     Is Non-Hispanic African American: No     Diabetic: Yes     Tobacco smoker: Yes     Systolic Blood Pressure: 366 mmHg     Is BP treated: Yes     HDL Cholesterol: 35 mg/dL     Total Cholesterol: 237 mg/dL   Patient has failed these meds in past: Not available  Patient is currently uncontrolled on the following medications:  . Atorvastatin 40 mg qhs  We discussed:  {CHL HP Upstream Pharmacy discussion:[phone]}  Plan  Continue {CHL HP Upstream Pharmacy Plans:[phone]}   Diabetes Type 1   A1c goal <8%  Recent Relevant Labs: Lab Results  Component Value Date/Time   HGBA1C 12.9 (H) 09/01/2019 09:45 AM   MICROALBUR 10 09/01/2019 09:45 AM    Last diabetic Eye exam: No results found for: HMDIABEYEEXA  Last diabetic Foot exam: No results found for: HMDIABFOOTEX   Checking BG: {CHL HP Blood Glucose Monitoring [phone]}  Recent FBG Readings: *** Recent pre-meal BG readings: *** Recent 2hr PP BG readings:  *** Recent HS BG readings: ***  Patient has failed these meds in past: Not available Patient is currently {CHL Controlled/Uncontrolled:[phone]} on the following medications: . Lantus solostar 42 u qhs . Lispro (humalog quikpen) 4-18 u 3-4 times daily   We discussed: diet and exercise extensively, how to recognize and treat signs of hypoglycemia and smoking cessation. CGM ***?  Plan  Continue {CHL HP Upstream Pharmacy Plans:[phone]}  Depression / Anxiety   PHQ9 Score:  PHQ9 SCORE ONLY 09/01/2019  PHQ-9 Total  Score 4   GAD7 Score: GAD 7 : Generalized Anxiety Score 09/01/2019  Nervous, Anxious, on Edge 2  Control/stop worrying 1  Worry too much - different things 1  Trouble relaxing 2  Restless 1  Easily annoyed or irritable 1  Afraid - awful might happen 0  Total GAD 7 Score 8  Anxiety Difficulty Somewhat difficult    Patient has failed these meds in past: Not available Patient is currently {CHL Controlled/Uncontrolled:[phone]} on the following medications:  . Duloxetine 30 mg bid . Hydroxyzine 25 mg tid . Diazepam 26m q6h prn ?  We discussed:  ***  Plan  Continue {CHL HP Upstream Pharmacy PRUEAV:[id number]} Seizure Disorder   Patient has failed these meds in past: Not available Patient is currently {CHL Controlled/Uncontrolled:[phone]} on the following medications:  . Topiramate 100 mg bid  We discussed:  ***  Plan  Continue {CHL HP Upstream Pharmacy Plans:[phone]}     Vit D, 25-Hydroxy  Date Value Ref Range Status  09/01/2019 15.7 (L) 30.0 - 100.0 ng/mL Final    Comment:    Vitamin D deficiency has been defined by the ICamuypractice guideline as a level of serum 25-OH vitamin D less than 20 ng/mL (1,2). The Endocrine Society went on to further define vitamin D insufficiency as a level between 21 and 29 ng/mL (2). 1. IOM (Institute of Medicine). 2010. Dietary reference    intakes for calcium and D. [place] The    [employer] 2. Holick MF, Binkley Gwinn, Bischoff-Ferrari HA, et al.    Evaluation, treatment, and prevention of vitamin D    deficiency: an Endocrine Society clinical practice    guideline. JCEM. 2011 Jul; 96(7):1911-30.     Patient has failed these meds in past: *** Patient is currently {CHL Controlled/Uncontrolled:[phone]} on the following medications:  . Cholecalciferol 50,000 units weekly for 8 weeks  We discussed:  ***[place] disease  Plan  Continue {CHL HP  Upstream Pharmacy Plans:[phone]}  Hypothyroidism   Lab Results  Component Value Date/Time   TSH 0.859 09/01/2019 11:04 AM   FREET4 1.88 (H) 09/01/2019 11:04 AM   H/O thyroid cancer s/p complete removal and radiation 2013-2014 Patient has failed these meds in past: not available Patient is currently {CHL Controlled/Uncontrolled:[phone]} on the following medications:  .  Levothyroxine 225 mcg qd  We discussed:  {CHL HP Upstream Pharmacy discussion:[phone]}  Plan  Continue {CHL HP Upstream Pharmacy Plans:[phone]}    Medication Management   Pt uses [facility] for all medications Uses pill box? {Yes or If no, why not?:20788} Pt endorses ***% compliance  We discussed: ***  Plan  {US Pharmacy YQMG:50037}    Follow up: *** month phone visit  ***

## 2019-09-11 ENCOUNTER — Encounter (INDEPENDENT_AMBULATORY_CARE_PROVIDER_SITE_OTHER): Payer: Self-pay

## 2019-09-11 ENCOUNTER — Inpatient Hospital Stay: Payer: Medicaid Other

## 2019-09-11 ENCOUNTER — Encounter: Payer: Self-pay | Admitting: Internal Medicine

## 2019-09-11 ENCOUNTER — Inpatient Hospital Stay: Payer: Medicaid Other | Attending: Internal Medicine | Admitting: Internal Medicine

## 2019-09-11 ENCOUNTER — Ambulatory Visit: Payer: Medicaid Other

## 2019-09-11 ENCOUNTER — Other Ambulatory Visit: Payer: Self-pay

## 2019-09-11 VITALS — BP 135/96 | HR 81 | Temp 97.4°F

## 2019-09-11 DIAGNOSIS — Z885 Allergy status to narcotic agent status: Secondary | ICD-10-CM | POA: Diagnosis not present

## 2019-09-11 DIAGNOSIS — Z808 Family history of malignant neoplasm of other organs or systems: Secondary | ICD-10-CM | POA: Diagnosis not present

## 2019-09-11 DIAGNOSIS — I10 Essential (primary) hypertension: Secondary | ICD-10-CM | POA: Diagnosis not present

## 2019-09-11 DIAGNOSIS — Z9049 Acquired absence of other specified parts of digestive tract: Secondary | ICD-10-CM | POA: Insufficient documentation

## 2019-09-11 DIAGNOSIS — M549 Dorsalgia, unspecified: Secondary | ICD-10-CM | POA: Insufficient documentation

## 2019-09-11 DIAGNOSIS — Z818 Family history of other mental and behavioral disorders: Secondary | ICD-10-CM

## 2019-09-11 DIAGNOSIS — Z794 Long term (current) use of insulin: Secondary | ICD-10-CM | POA: Insufficient documentation

## 2019-09-11 DIAGNOSIS — Z85038 Personal history of other malignant neoplasm of large intestine: Secondary | ICD-10-CM | POA: Diagnosis not present

## 2019-09-11 DIAGNOSIS — E119 Type 2 diabetes mellitus without complications: Secondary | ICD-10-CM | POA: Diagnosis not present

## 2019-09-11 DIAGNOSIS — D751 Secondary polycythemia: Secondary | ICD-10-CM | POA: Insufficient documentation

## 2019-09-11 DIAGNOSIS — D6800 Von Willebrand disease, unspecified: Secondary | ICD-10-CM

## 2019-09-11 DIAGNOSIS — Z79899 Other long term (current) drug therapy: Secondary | ICD-10-CM | POA: Diagnosis not present

## 2019-09-11 DIAGNOSIS — G8929 Other chronic pain: Secondary | ICD-10-CM | POA: Insufficient documentation

## 2019-09-11 DIAGNOSIS — Z7951 Long term (current) use of inhaled steroids: Secondary | ICD-10-CM | POA: Diagnosis not present

## 2019-09-11 DIAGNOSIS — J449 Chronic obstructive pulmonary disease, unspecified: Secondary | ICD-10-CM | POA: Diagnosis not present

## 2019-09-11 DIAGNOSIS — Z888 Allergy status to other drugs, medicaments and biological substances status: Secondary | ICD-10-CM | POA: Diagnosis not present

## 2019-09-11 DIAGNOSIS — R5383 Other fatigue: Secondary | ICD-10-CM | POA: Insufficient documentation

## 2019-09-11 DIAGNOSIS — F17210 Nicotine dependence, cigarettes, uncomplicated: Secondary | ICD-10-CM | POA: Diagnosis not present

## 2019-09-11 DIAGNOSIS — Z811 Family history of alcohol abuse and dependence: Secondary | ICD-10-CM | POA: Insufficient documentation

## 2019-09-11 DIAGNOSIS — Z801 Family history of malignant neoplasm of trachea, bronchus and lung: Secondary | ICD-10-CM | POA: Insufficient documentation

## 2019-09-11 DIAGNOSIS — M2550 Pain in unspecified joint: Secondary | ICD-10-CM | POA: Diagnosis not present

## 2019-09-11 DIAGNOSIS — Z8249 Family history of ischemic heart disease and other diseases of the circulatory system: Secondary | ICD-10-CM

## 2019-09-11 LAB — CBC WITH DIFFERENTIAL/PLATELET
Abs Immature Granulocytes: 0.03 10*3/uL (ref 0.00–0.07)
Basophils Absolute: 0 10*3/uL (ref 0.0–0.1)
Basophils Relative: 0 %
Eosinophils Absolute: 0.2 10*3/uL (ref 0.0–0.5)
Eosinophils Relative: 3 %
HCT: 48 % — ABNORMAL HIGH (ref 36.0–46.0)
Hemoglobin: 16 g/dL — ABNORMAL HIGH (ref 12.0–15.0)
Immature Granulocytes: 0 %
Lymphocytes Relative: 24 %
Lymphs Abs: 1.8 10*3/uL (ref 0.7–4.0)
MCH: 28.3 pg (ref 26.0–34.0)
MCHC: 33.3 g/dL (ref 30.0–36.0)
MCV: 84.8 fL (ref 80.0–100.0)
Monocytes Absolute: 0.4 10*3/uL (ref 0.1–1.0)
Monocytes Relative: 5 %
Neutro Abs: 5.3 10*3/uL (ref 1.7–7.7)
Neutrophils Relative %: 68 %
Platelets: 247 10*3/uL (ref 150–400)
RBC: 5.66 MIL/uL — ABNORMAL HIGH (ref 3.87–5.11)
RDW: 12.7 % (ref 11.5–15.5)
WBC: 7.7 10*3/uL (ref 4.0–10.5)
nRBC: 0 % (ref 0.0–0.2)

## 2019-09-11 LAB — DRUG SCREEN 764883 11+OXYCO+ALC+CRT-BUND
Amphetamines, Urine: NEGATIVE ng/mL
BENZODIAZ UR QL: NEGATIVE ng/mL
Barbiturate: NEGATIVE ng/mL
Cocaine (Metabolite): NEGATIVE ng/mL
Creatinine: 30.2 mg/dL (ref 20.0–300.0)
Ethanol: NEGATIVE %
Meperidine: NEGATIVE ng/mL
Methadone Screen, Urine: NEGATIVE ng/mL
OPIATE SCREEN URINE: NEGATIVE ng/mL
Oxycodone/Oxymorphone, Urine: NEGATIVE ng/mL
Phencyclidine: NEGATIVE ng/mL
Propoxyphene: NEGATIVE ng/mL
Tramadol: NEGATIVE ng/mL
pH, Urine: 5.6 (ref 4.5–8.9)

## 2019-09-11 LAB — COMPREHENSIVE METABOLIC PANEL
ALT: 27 U/L (ref 0–44)
AST: 16 U/L (ref 15–41)
Albumin: 4.2 g/dL (ref 3.5–5.0)
Alkaline Phosphatase: 162 U/L — ABNORMAL HIGH (ref 38–126)
Anion gap: 10 (ref 5–15)
BUN: 13 mg/dL (ref 6–20)
CO2: 23 mmol/L (ref 22–32)
Calcium: 9.1 mg/dL (ref 8.9–10.3)
Chloride: 102 mmol/L (ref 98–111)
Creatinine, Ser: 0.73 mg/dL (ref 0.44–1.00)
GFR calc Af Amer: >60 mL/min (ref >60)
GFR calc non Af Amer: >60 mL/min (ref >60)
Glucose, Bld: 387 mg/dL — ABNORMAL HIGH (ref 70–99)
Potassium: 4 mmol/L (ref 3.5–5.1)
Sodium: 135 mmol/L (ref 135–145)
Total Bilirubin: 0.7 mg/dL (ref 0.3–1.2)
Total Protein: 8.4 g/dL — ABNORMAL HIGH (ref 6.5–8.1)

## 2019-09-11 LAB — LACTATE DEHYDROGENASE: LDH: 117 U/L (ref 98–192)

## 2019-09-11 LAB — PROTIME-INR
INR: 1 (ref 0.8–1.2)
Prothrombin Time: 12.3 seconds (ref 11.4–15.2)

## 2019-09-11 LAB — APTT: aPTT: 31 seconds (ref 24–36)

## 2019-09-11 LAB — CANNABINOID CONFIRMATION, UR: CANNABINOIDS: NEGATIVE

## 2019-09-11 NOTE — Progress Notes (Signed)
Patient here today for initial visit. She states she has having some pain all over and rates pain at 6.

## 2019-09-11 NOTE — Assessment & Plan Note (Addendum)
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  Check von Willebrand panel.  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   # Hx of colon cancer [2009]- ? Stage II- ? 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  Clinically evidence of recurrence.  Check thyroid markers.  # Genetics: Discuss.   # Hx of hospice/ awaiting palliative care evaluation on outpatient basis.  DISPOSITION: records/release sign # labs today- cbc/cmp;CEA; thyroid profile; thyroglobulin; antibodies; VWD work up. # follow up in 2 weeks; no labs -Dr.B  Thank you, Ms. Marnee Guarneri for allowing me to participate in the care of your pleasant patient. Please do not hesitate to contact me with questions or concerns in the interim.
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
## 2019-09-12 ENCOUNTER — Telehealth: Payer: Self-pay

## 2019-09-12 ENCOUNTER — Telehealth: Payer: Medicaid Other

## 2019-09-12 ENCOUNTER — Telehealth: Payer: Self-pay | Admitting: Pharmacist

## 2019-09-12 ENCOUNTER — Other Ambulatory Visit: Payer: Self-pay

## 2019-09-12 ENCOUNTER — Other Ambulatory Visit: Payer: Medicaid Other | Admitting: Adult Health Nurse Practitioner

## 2019-09-12 DIAGNOSIS — Z515 Encounter for palliative care: Secondary | ICD-10-CM

## 2019-09-12 DIAGNOSIS — G35 Multiple sclerosis: Secondary | ICD-10-CM

## 2019-09-12 LAB — THYROGLOBULIN BY IMA: Thyroglobulin by IMA: <0.1 ng/mL — ABNORMAL LOW (ref 1.5–38.5)

## 2019-09-12 LAB — COAG STUDIES INTERP REPORT

## 2019-09-12 LAB — THYROID PANEL WITH TSH
Free Thyroxine Index: 3.2 (ref 1.2–4.9)
T3 Uptake Ratio: 30 % (ref 24–39)
T4, Total: 10.6 ug/dL (ref 4.5–12.0)
TSH: 0.326 u[IU]/mL — ABNORMAL LOW (ref 0.450–4.500)

## 2019-09-12 LAB — VON WILLEBRAND PANEL
Coagulation Factor VIII: 250 % — ABNORMAL HIGH (ref 56–140)
Ristocetin Co-factor, Plasma: 137 % (ref 50–200)
Von Willebrand Antigen, Plasma: 150 % (ref 50–200)

## 2019-09-12 LAB — CEA: CEA: 3 ng/mL (ref 0.0–4.7)

## 2019-09-12 LAB — TGAB+THYROGLOBULIN IMA OR RIA: Thyroglobulin Antibody: <1 IU/mL (ref 0.0–0.9)

## 2019-09-12 NOTE — Progress Notes (Addendum)
  Chronic Care Management   Outreach Note  09/12/2019 Name: Monisha Siebel MRN: [id number] DOB: August 29, 1965  Referred by: Venita Lick, NP Reason for referral : Chronic Care Management  A second unsuccessful telephone outreach was attempted today. The patient was referred to pharmacist for assistance with care management and care coordination.  Follow Up Plan: Will follow up with patient for rescheduling in 2-3 days.    Junita Push. Kenton Kingfisher PharmD, [facility] [phone]

## 2019-09-12 NOTE — Telephone Encounter (Signed)
Received a referral for patient to be scheduled a colonoscopy due to her history of colon cancer in 2009 (lived in [place] at the time).  She has not had a colonoscopy since 2016.    During her triage she stated that she is experiencing the following GI symptoms: Change in bowel habits from severe diarrhea to constipation, feels like a tight band is around her stomach, bloating and loud stomach sounds.  An appt has been scheduled with Dr. Vicente Males on 11/23/19 at 1pm.  Patient has been advised that we will place her on a wait list should something come open sooner.  She appreciates this, and understands the wait to be seen.  Thanks,  [place], [place]

## 2019-09-12 NOTE — Telephone Encounter (Signed)
Sounds good
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
## 2019-09-14 LAB — JAK2 GENOTYPR

## 2019-09-18 ENCOUNTER — Ambulatory Visit: Payer: Medicaid Other | Admitting: Licensed Clinical Social Worker
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
## 2019-09-18 NOTE — Telephone Encounter (Signed)
At request of NP, message sent to PCP to request PT/OT eval and treat.

## 2019-09-20 NOTE — Addendum Note (Signed)
Addended by: Marnee Guarneri T on: 09/20/2019 10:01 AM   Modules accepted: Orders

## 2019-09-25 ENCOUNTER — Inpatient Hospital Stay: Payer: Medicaid Other | Attending: Internal Medicine | Admitting: Internal Medicine

## 2019-09-25 ENCOUNTER — Other Ambulatory Visit: Payer: Self-pay

## 2019-09-25 ENCOUNTER — Encounter: Payer: Self-pay | Admitting: Internal Medicine

## 2019-09-25 VITALS — BP 124/81 | HR 68 | Temp 96.5°F | Resp 16 | Ht 65.5 in

## 2019-09-25 DIAGNOSIS — D751 Secondary polycythemia: Secondary | ICD-10-CM | POA: Diagnosis not present

## 2019-09-25 DIAGNOSIS — J449 Chronic obstructive pulmonary disease, unspecified: Secondary | ICD-10-CM | POA: Insufficient documentation

## 2019-09-25 DIAGNOSIS — I10 Essential (primary) hypertension: Secondary | ICD-10-CM | POA: Insufficient documentation

## 2019-09-25 DIAGNOSIS — M549 Dorsalgia, unspecified: Secondary | ICD-10-CM | POA: Diagnosis not present

## 2019-09-25 DIAGNOSIS — Z885 Allergy status to narcotic agent status: Secondary | ICD-10-CM | POA: Insufficient documentation

## 2019-09-25 DIAGNOSIS — Z85038 Personal history of other malignant neoplasm of large intestine: Secondary | ICD-10-CM | POA: Diagnosis not present

## 2019-09-25 DIAGNOSIS — Z794 Long term (current) use of insulin: Secondary | ICD-10-CM | POA: Insufficient documentation

## 2019-09-25 DIAGNOSIS — Z9049 Acquired absence of other specified parts of digestive tract: Secondary | ICD-10-CM | POA: Insufficient documentation

## 2019-09-25 DIAGNOSIS — M1990 Unspecified osteoarthritis, unspecified site: Secondary | ICD-10-CM | POA: Insufficient documentation

## 2019-09-25 DIAGNOSIS — Z85850 Personal history of malignant neoplasm of thyroid: Secondary | ICD-10-CM | POA: Diagnosis not present

## 2019-09-25 DIAGNOSIS — Z8249 Family history of ischemic heart disease and other diseases of the circulatory system: Secondary | ICD-10-CM | POA: Diagnosis not present

## 2019-09-25 DIAGNOSIS — G8929 Other chronic pain: Secondary | ICD-10-CM | POA: Diagnosis not present

## 2019-09-25 DIAGNOSIS — R5383 Other fatigue: Secondary | ICD-10-CM | POA: Insufficient documentation

## 2019-09-25 DIAGNOSIS — E109 Type 1 diabetes mellitus without complications: Secondary | ICD-10-CM | POA: Diagnosis not present

## 2019-09-25 DIAGNOSIS — D6800 Von Willebrand disease, unspecified: Secondary | ICD-10-CM

## 2019-09-25 DIAGNOSIS — F17210 Nicotine dependence, cigarettes, uncomplicated: Secondary | ICD-10-CM | POA: Insufficient documentation

## 2019-09-25 DIAGNOSIS — C7A00 Malignant carcinoid tumor of unspecified site: Secondary | ICD-10-CM

## 2019-09-25 DIAGNOSIS — Z811 Family history of alcohol abuse and dependence: Secondary | ICD-10-CM | POA: Insufficient documentation

## 2019-09-25 DIAGNOSIS — Z888 Allergy status to other drugs, medicaments and biological substances status: Secondary | ICD-10-CM | POA: Diagnosis not present

## 2019-09-25 DIAGNOSIS — F329 Major depressive disorder, single episode, unspecified: Secondary | ICD-10-CM | POA: Diagnosis not present

## 2019-09-25 DIAGNOSIS — Z808 Family history of malignant neoplasm of other organs or systems: Secondary | ICD-10-CM | POA: Diagnosis not present

## 2019-09-25 DIAGNOSIS — Z801 Family history of malignant neoplasm of trachea, bronchus and lung: Secondary | ICD-10-CM | POA: Diagnosis not present

## 2019-09-25 DIAGNOSIS — Z79899 Other long term (current) drug therapy: Secondary | ICD-10-CM | POA: Insufficient documentation

## 2019-09-25 NOTE — Assessment & Plan Note (Addendum)
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 Factor/blood products/injection] only with procedures or surgeries; repeat panel Normal. Awaiting records from prior hematologist.  Continue surveillance.  # Erythrocytosis- Hb 15/HCT 17; clinically secondary; JAK2-NEGATIVE.   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 ? Octreoscan scan.  Await records from previous oncologist.  # Hx of colon cancer [2009]- ? Stage II-CEA normal.  Clinically no evidence of recurrence.  Awaiting GI evaluation end of this month/surveillance colonoscopy.  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  Clinically evidence of recurrence.  Thyroid markers- negative.  TSH-0.3; normal T4/T3 [suppressive dose of Synthroid; goal TSH unclear].  Await [place] oncology.  Continue Synthroid at the current dose.  # Genetics: Given the multiple malignancies; patient has 4 children-reasonable to make a referral to genetics.  # s/p Palliative evaluation- July 2021- shower chair/ PT/OT; defer to PCP regarding disabled parking pass.  DISPOSITION: # referral to genetics counseling re: multiple malignancies  #  Follow up -in 6 months- MD;1 week PRIOR- labs- cbc/cmp/cea; thyroid profile; Thyroglobulin/ with antibodies;  Chromogranin A levels- Dr.B  Cc: Jolene Cannady-
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
## 2019-10-04 ENCOUNTER — Inpatient Hospital Stay (HOSPITAL_BASED_OUTPATIENT_CLINIC_OR_DEPARTMENT_OTHER): Payer: Medicaid Other | Admitting: Licensed Clinical Social Worker

## 2019-10-04 ENCOUNTER — Other Ambulatory Visit: Payer: Self-pay

## 2019-10-04 ENCOUNTER — Inpatient Hospital Stay: Payer: Medicaid Other

## 2019-10-04 ENCOUNTER — Encounter: Payer: Self-pay | Admitting: Licensed Clinical Social Worker

## 2019-10-04 DIAGNOSIS — Z8501 Personal history of malignant neoplasm of esophagus: Secondary | ICD-10-CM | POA: Insufficient documentation

## 2019-10-04 DIAGNOSIS — Z85850 Personal history of malignant neoplasm of thyroid: Secondary | ICD-10-CM

## 2019-10-04 DIAGNOSIS — Z808 Family history of malignant neoplasm of other organs or systems: Secondary | ICD-10-CM | POA: Diagnosis not present

## 2019-10-04 DIAGNOSIS — Z85038 Personal history of other malignant neoplasm of large intestine: Secondary | ICD-10-CM

## 2019-10-04 DIAGNOSIS — Z807 Family history of other malignant neoplasms of lymphoid, hematopoietic and related tissues: Secondary | ICD-10-CM | POA: Diagnosis not present

## 2019-10-04 DIAGNOSIS — Z806 Family history of leukemia: Secondary | ICD-10-CM | POA: Diagnosis not present
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
## 2019-10-06 ENCOUNTER — Telehealth: Payer: Medicaid Other

## 2019-10-06 ENCOUNTER — Telehealth: Payer: Self-pay | Admitting: General Practice

## 2019-10-06 NOTE — Telephone Encounter (Signed)
  Chronic Care Management   Outreach Note  10/06/2019 Name: Margaret Church MRN: [id number] DOB: Aug 30, 1965  Referred by: Venita Lick, NP Reason for referral : Appointment [facility] Initial outreach for Chronic Disease Management and Care Coordination Needs)   An unsuccessful telephone outreach was attempted today. The patient was referred to the case management team for assistance with care management and care coordination.   Follow Up Plan: The care management team will reach out to the patient again over the next 30 days.   Noreene Larsson RN, MSN, [facility] Mobile: [phone]

## 2019-10-10 ENCOUNTER — Ambulatory Visit: Payer: Medicaid Other | Admitting: Nurse Practitioner

## 2019-10-11 ENCOUNTER — Telehealth: Payer: Self-pay

## 2019-10-11 ENCOUNTER — Ambulatory Visit: Payer: Medicaid Other | Admitting: Nurse Practitioner

## 2019-10-11 DIAGNOSIS — Z87820 Personal history of traumatic brain injury: Secondary | ICD-10-CM | POA: Diagnosis not present

## 2019-10-11 DIAGNOSIS — R569 Unspecified convulsions: Secondary | ICD-10-CM | POA: Diagnosis not present

## 2019-10-11 DIAGNOSIS — G35 Multiple sclerosis: Secondary | ICD-10-CM | POA: Diagnosis not present

## 2019-10-11 DIAGNOSIS — G2581 Restless legs syndrome: Secondary | ICD-10-CM | POA: Diagnosis not present

## 2019-10-11 NOTE — Chronic Care Management (AMB) (Signed)
  Care Management   Note  10/11/2019 Name: Margaret Church MRN: [id number] DOB: [birth year]  Margaret Church is a 53 y.o. year old female who is a primary care patient of Venita Lick, NP and is actively engaged with the care management team. I reached out to Margaret Church by phone today to assist with re-scheduling an initial visit with the RN Case Manager  Follow up plan: Telephone appointment with care management team member scheduled for: The care management team will reach out to the patient again over the next 7 days.  If patient returns call to provider office, please advise to call [place]  at [place], [place], [place], [place] [address] Direct Dial: [phone] [email or web address]
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
## 2019-10-13 ENCOUNTER — Telehealth: Payer: Self-pay

## 2019-10-13 NOTE — Telephone Encounter (Signed)
Spoke to patient and made her aware of below message.  Patient is agreeable with rescheduling with MD.  I have made her aware that our office will contact her to schedule with Dr. Domingo Dimes once her schedule has been opened for October.   Will leave in triage to ensure f/u.

## 2019-10-13 NOTE — Telephone Encounter (Signed)
Patient is schedule with Wyn Quaker, NP on 10/16/2019 for pulmonary consult.  Left message to reschedule with MD.

## 2019-10-16 ENCOUNTER — Encounter: Payer: Self-pay | Admitting: Licensed Clinical Social Worker

## 2019-10-16 ENCOUNTER — Telehealth: Payer: Self-pay | Admitting: Licensed Clinical Social Worker

## 2019-10-16 ENCOUNTER — Institutional Professional Consult (permissible substitution): Payer: Medicaid Other | Admitting: Pulmonary Disease

## 2019-10-16 ENCOUNTER — Ambulatory Visit: Payer: Self-pay | Admitting: Licensed Clinical Social Worker

## 2019-10-16 DIAGNOSIS — Z85850 Personal history of malignant neoplasm of thyroid: Secondary | ICD-10-CM

## 2019-10-16 DIAGNOSIS — Z806 Family history of leukemia: Secondary | ICD-10-CM

## 2019-10-16 DIAGNOSIS — Z1379 Encounter for other screening for genetic and chromosomal anomalies: Secondary | ICD-10-CM | POA: Insufficient documentation

## 2019-10-16 DIAGNOSIS — Z808 Family history of malignant neoplasm of other organs or systems: Secondary | ICD-10-CM

## 2019-10-16 DIAGNOSIS — Z85038 Personal history of other malignant neoplasm of large intestine: Secondary | ICD-10-CM

## 2019-10-16 DIAGNOSIS — Z807 Family history of other malignant neoplasms of lymphoid, hematopoietic and related tissues: Secondary | ICD-10-CM

## 2019-10-16 NOTE — Telephone Encounter (Signed)
Revealed negative genetic testing.  We discussed that we do not know why she has had her cancers or why there is cancer in the family. It could be due to a different gene that we are not testing, or something our current technology cannot pick up.  It will be important for her to keep in contact with genetics to learn if additional testing may be needed in the future.

## 2019-10-16 NOTE — Progress Notes (Signed)
HPI:  Margaret Church was previously seen in the [place] clinic due to a personal and family history of cancer and concerns regarding a hereditary predisposition to cancer. Please refer to our prior cancer genetics clinic note for more information regarding our discussion, assessment and recommendations, at the time. Margaret Church recent genetic test results were disclosed to her, as were recommendations warranted by these results. These results and recommendations are discussed in more detail below.  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  At the age of 21, Margaret Church was diagnosed with colon cancer. This was treated with surgery and chemotherapy. At the age of 35, Margaret Church was diagnosed with thyroid cancer. Patient also has Von Willebrand disease.   CANCER HISTORY:  Oncology History   No history exists.    FAMILY HISTORY:  We obtained a detailed, 4-generation family history.  Significant diagnoses are listed below: Family History  Problem Relation Age of Onset  . Lung cancer Mother 50  . Cancer Mother        skin cancer;   . Heart disease Mother   . Alzheimer's disease Father   . Cancer Father        skin cancer  . Hypertension Father   . Hip fracture Brother   . Hypertension Brother   . Skin cancer Brother   . Lymphoma Paternal Grandmother   . Alcohol abuse Brother   . Hypertension Brother   . Other Brother        large tumor on thigh removed in [place]  . Multiple myeloma Paternal Uncle   . Von Willebrand disease Paternal Aunt        and carcinoid disease  . Skin cancer Maternal Aunt   . Thyroid cancer Other   . Skin cancer Maternal Aunt   . Cancer Paternal Uncle        unk type  . Liver cancer Paternal Uncle   . Esophageal cancer Paternal Aunt   . Colon polyps Daughter 57    Margaret Church has 3 sons and 1 daughter. Her daughter is 37 and was recently found to have colon polyps and  possibly cancer. Margaret Church has 3 brothers. One brother had skin cancer and has a lot of sun exposure. Another brother had a large tumor removed from his thigh when he was in [place] but is not sure what it was.   Margaret Church mother has had over 69 skin cancers removed from her face and neck, [place] had [place] exposure. She also was recently diagnosed with lung cancer. Patient has 2 maternal aunts, both have had skin cancers as well and one possibly had breast cancer. No cancers in maternal cousins. Maternal grandmother had skin cancer and died at 67, patient does not have information about grandfather but his sister did have thyroid cancer.   Margaret Church father had skin cancer and early onset Alzheimer's. Patient had 6 paternal aunts and 6 paternal uncles. One uncle had multiple myeloma, another uncle had liver cancer, and another uncle had cancer but she is unsure the type. An aunt had carcinoid tumors, another aunt had esophageal cancer. A cousin had leukemia and another cousin possibly had endometrial cancer. Paternal grandmother had lymphoma.   Margaret Church is unaware of previous family history of genetic testing for hereditary cancer risks. Patient's ancestors are of [place], [place], [place] and [place] descent.  There is  no reported Ashkenazi Jewish ancestry. There is no known consanguinity.  GENETIC TEST RESULTS: Genetic testing reported out on 10/12/2019 through the [facility]- cancer panel found no pathogenic mutations.   The Multi-Cancer Panel offered by [facility] includes sequencing and/or deletion duplication testing of the following 85 genes: AIP, ALK, APC, ATM, AXIN2,BAP1,  BARD1, BLM, BMPR1A, BRCA1, BRCA2, BRIP1, CASR, CDC73, CDH1, CDK4, CDKN1B, CDKN1C, CDKN2A (p14ARF), CDKN2A (p16INK4a), CEBPA, CHEK2, CTNNA1, DICER1, DIS3L2, EGFR (c.2369C>T, p.Thr790Met variant only), EPCAM (Deletion/duplication testing only), FH, FLCN, GATA2, GPC3,  GREM1 (Promoter region deletion/duplication testing only), HOXB13 (c.251G>A, p.Gly84Glu), HRAS, KIT, MAX, MEN1, MET, MITF (c.952G>A, p.Glu318Lys variant only), MLH1, MSH2, MSH3, MSH6, MUTYH, NBN, NF1, NF2, NTHL1, PALB2, PDGFRA, PHOX2B, PMS2, POLD1, POLE, POT1, PRKAR1A, PTCH1, PTEN, RAD50, RAD51C, RAD51D, RB1, RECQL4, RET, RNF43, RUNX1, SDHAF2, SDHA (sequence changes only), SDHB, SDHC, SDHD, SMAD4, SMARCA4, SMARCB1, SMARCE1, STK11, SUFU, TERC, TERT, TMEM127, TP53, TSC1, TSC2, VHL, WRN and WT1.   The test report has been scanned into EPIC and is located under the Molecular Pathology section of the Results Review tab.  A portion of the result report is included below for reference.     We discussed with Margaret Church that because current genetic testing is not perfect, it is possible there may be a gene mutation in one of these genes that current testing cannot detect, but that chance is small.  We also discussed, that there could be another gene that has not yet been discovered, or that we have not yet tested, that is responsible for the cancer diagnoses in the family. It is also possible there is a hereditary cause for the cancer in the family that Margaret Church did not inherit and therefore was not identified in her testing.  Therefore, it is important to remain in touch with cancer genetics in the future so that we can continue to offer Margaret Church the most up to date genetic testing.   ADDITIONAL GENETIC TESTING: We discussed with Margaret Church that her genetic testing was fairly extensive.  If there are genes identified to increase cancer risk that can be analyzed in the future, we would be happy to discuss and coordinate this testing at that time.    CANCER SCREENING RECOMMENDATIONS: Margaret Church test result is considered negative (normal).  This means that we have not identified a hereditary cause for her  personal and family history of cancer at this time.     While reassuring, this does not  definitively rule out a hereditary predisposition to cancer. It is still possible that there could be genetic mutations that are undetectable by current technology. There could be genetic mutations in genes that have not been tested or identified to increase cancer risk.  Therefore, it is recommended she continue to follow the cancer management and screening guidelines provided by her oncology and primary healthcare provider.   An individual's cancer risk and medical management are not determined by genetic test results alone. Overall cancer risk assessment incorporates additional factors, including personal medical history, family history, and any available genetic information that may result in a personalized plan for cancer prevention and surveillance.  RECOMMENDATIONS FOR FAMILY MEMBERS:  Relatives in this family might be at some increased risk of developing cancer, over the general population risk, simply due to the family history of cancer.  We recommended female relatives in this family have a yearly mammogram beginning at age 34, or 33 years younger than the earliest onset of cancer, an annual clinical breast exam, and perform  monthly breast self-exams. Female relatives in this family should also have a gynecological exam as recommended by their primary provider.  All family members should be referred for colonoscopy starting at age 7. Her children could talk with their doctors about having colonoscopies starting at 65 since she was diagnosed with colon cancer at 76.   FOLLOW-UP: Lastly, we discussed with Margaret Church that cancer genetics is a rapidly advancing field and it is possible that new genetic tests will be appropriate for her and/or her family members in the future. We encouraged her to remain in contact with cancer genetics on an annual basis so we can update her personal and family histories and let her know of advances in cancer genetics that may benefit this family.   Our contact  number was provided. Margaret Church questions were answered to her satisfaction, and she knows she is welcome to call us at anytime with additional questions or concerns.   Faith Rogue, Margaret, [facility] Genetic Counselor [email or web address] Phone: [phone]

## 2019-10-17 ENCOUNTER — Ambulatory Visit: Payer: Medicaid Other | Admitting: Nurse Practitioner

## 2019-10-18 NOTE — Chronic Care Management (AMB) (Signed)
  Care Management   Note  10/18/2019 Name: Margaret Church MRN: [id number] DOB: 1965/04/06  Margaret Church is a 54 y.o. year old female who is a primary care patient of Venita Lick, NP and is actively engaged with the care management team. I reached out to Margaret Church by phone today to assist with re-scheduling an initial visit with the RN Case Manager  Follow up plan: Telephone appointment with care management team member scheduled for:12/01/2019  Margaret Church, [place], [place], [place] [address] Direct Dial: [phone] [email or web address]

## 2019-10-18 NOTE — Telephone Encounter (Signed)
Pt has been r/s for 12/01/2019

## 2019-10-19 ENCOUNTER — Other Ambulatory Visit: Payer: Self-pay | Admitting: Neurology

## 2019-10-19 DIAGNOSIS — G35 Multiple sclerosis: Secondary | ICD-10-CM

## 2019-10-20 DIAGNOSIS — R569 Unspecified convulsions: Secondary | ICD-10-CM | POA: Diagnosis not present
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
## 2019-10-24 ENCOUNTER — Other Ambulatory Visit: Payer: Self-pay

## 2019-10-24 ENCOUNTER — Other Ambulatory Visit: Payer: Medicaid Other | Admitting: Adult Health Nurse Practitioner

## 2019-10-24 DIAGNOSIS — Z515 Encounter for palliative care: Secondary | ICD-10-CM
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
## 2019-10-24 NOTE — Telephone Encounter (Signed)
Patient has been scheduled for consult on 11/27/2019 at 11:00. patient is aware and voiced her understanding.  Nothing further is needed at this time.

## 2019-10-27 ENCOUNTER — Encounter: Payer: Self-pay | Admitting: Nurse Practitioner

## 2019-10-27 DIAGNOSIS — E559 Vitamin D deficiency, unspecified: Secondary | ICD-10-CM | POA: Insufficient documentation

## 2019-10-30 ENCOUNTER — Other Ambulatory Visit: Payer: Self-pay | Admitting: Nurse Practitioner

## 2019-10-30 NOTE — Telephone Encounter (Signed)
Requested medication (s) are due for refill today: Yes  Requested medication (s) are on the active medication list: Yes and No  Last refill:  1 month ago  Future visit scheduled: Yes  Notes to clinic:  Unable to refill, Vitamin D level needs to be rechecked, lancets not on medication list, kit already ordered     Requested Prescriptions  Pending Prescriptions Disp Refills   Blood Glucose Monitoring Suppl (ACCU-CHEK GUIDE ME) w/Device KIT [Pharmacy Med Name: [facility]  0    Sig: USE TO TEST BLOOD SUGAR [place]      Endocrinology: Diabetes - Testing Supplies Passed - 10/30/2019  6:41 PM      Passed - Valid encounter within last 12 months    Recent Outpatient Visits           1 month ago Depression, recurrent ([place])   [place], Henrine Screws T, NP   1 month ago Encounter to establish care   [place], Barbaraann Faster, NP       Future Appointments             In 2 days [place], Barbaraann Faster, NP [facility]              Accu-Chek Softclix Lancets lancets [employer] Med Name: [place] 100 each 0    Sig: USE TO [place]      Endocrinology: Diabetes - Testing Supplies Passed - 10/30/2019  6:41 PM      Passed - Valid encounter within last 12 months    Recent Outpatient Visits           1 month ago Depression, recurrent ([place])   [place], Jolene T, NP   1 month ago Encounter to establish care   [place], Henrine Screws T, NP       Future Appointments             In 2 days [place], Barbaraann Faster, NP [facility]              Cholecalciferol (VITAMIN D3) 1.25 MG (50000 UT) CAPS [Pharmacy Med Name: Vitamin D3 50000 UNIT Oral Capsule] 8 capsule 0    Sig: TAKE 1 CAPSULE BY MOUTH ONCE A WEEK FOR  8  WEEKS AND THEN  STOP  RETURN  TO  OFFICE  FOR  LAB  DRAW      Endocrinology:  Vitamins - Vitamin D  Supplementation Failed - 10/30/2019  6:41 PM      Failed - 50,000 IU strengths are not delegated      Failed - Phosphate in normal range and within 360 days    No results found for: PHOS        Failed - Vitamin D in normal range and within 360 days    Vit D, 25-Hydroxy  Date Value Ref Range Status  09/01/2019 15.7 (L) 30.0 - 100.0 ng/mL Final    Comment:    Vitamin D deficiency has been defined by the Institute of Medicine and an Endocrine Society practice guideline as a level of serum 25-OH vitamin D less than 20 ng/mL (1,2). The Endocrine Society went on to further define vitamin D insufficiency as a level between 21 and 29 ng/mL (2). 1. IOM (Institute of Medicine). 2010. Dietary reference    intakes for calcium and D. [place]: The  [employer]. 2. Holick MF, Binkley Sherando, Bischoff-Ferrari HA, et al.    Evaluation, treatment, and prevention of vitamin D    deficiency: an Endocrine Society clinical practice    guideline. JCEM. 2011 Jul; 96(7):1911-30.           Passed - Ca in normal range and within 360 days    Calcium  Date Value Ref Range Status  09/11/2019 9.1 8.9 - 10.3 mg/dL Final          Passed - Valid encounter within last 12 months    Recent Outpatient Visits           1 month ago Depression, recurrent ([place])   [place], Henrine Screws T, NP   1 month ago Encounter to establish care   [facility] [place], Barbaraann Faster, NP       Future Appointments             In 2 days Venita Lick, NP [facility]

## 2019-10-31 ENCOUNTER — Telehealth: Payer: Self-pay | Admitting: Nurse Practitioner

## 2019-10-31 NOTE — Telephone Encounter (Signed)
LOV 09/06/19

## 2019-10-31 NOTE — Telephone Encounter (Signed)
LVM TO RESCHEDULE THIS APT DUE TO PROVIDER NOT IN OFFICE DUE TO FAMILY EMERGENCY

## 2019-11-01 ENCOUNTER — Other Ambulatory Visit: Payer: Self-pay

## 2019-11-01 ENCOUNTER — Ambulatory Visit: Payer: Medicaid Other | Admitting: Nurse Practitioner

## 2019-11-01 MED ORDER — BACLOFEN 20 MG PO TABS
20.0000 mg | ORAL_TABLET | Freq: Two times a day (BID) | ORAL | 4 refills | Status: DC
Start: 2019-11-01 — End: 2020-09-17

## 2019-11-01 NOTE — Telephone Encounter (Signed)
Patient last seen in July.

## 2019-11-06 ENCOUNTER — Other Ambulatory Visit: Payer: Self-pay | Admitting: Nurse Practitioner

## 2019-11-06 ENCOUNTER — Telehealth: Payer: Medicaid Other

## 2019-11-07 ENCOUNTER — Telehealth: Payer: Self-pay | Admitting: Nurse Practitioner

## 2019-11-07 ENCOUNTER — Ambulatory Visit: Payer: Medicaid Other | Admitting: Nurse Practitioner

## 2019-11-07 NOTE — Telephone Encounter (Signed)
Copied from [place] [id number]. Topic: General - Call Back - No Documentation >> Nov 07, 2019  2:37 PM Erick Blinks wrote: Receiving a Rx of Diazepam from Dr. Melrose Nakayama. There is an overlap of Rx   Melissa, [facility] on [address]

## 2019-11-08 NOTE — Telephone Encounter (Signed)
[facility] and spoke with pharmacy tech. He cannot find where anything needs to be done at this time as the medication was dispensed to the patient. He stated that it looked like there was a possible drug to drug interaction and that it was resolved already. Will have Melissa call back if anything further needs to be done.

## 2019-11-11 ENCOUNTER — Other Ambulatory Visit: Payer: Self-pay

## 2019-11-11 ENCOUNTER — Ambulatory Visit
Admission: RE | Admit: 2019-11-11 | Discharge: 2019-11-11 | Disposition: A | Discharge status: Home / Self Care | Payer: Medicaid Other | Source: Ambulatory Visit | Attending: Neurology | Admitting: Neurology

## 2019-11-11 DIAGNOSIS — G35 Multiple sclerosis: Secondary | ICD-10-CM | POA: Diagnosis not present

## 2019-11-11 DIAGNOSIS — J0120 Acute ethmoidal sinusitis, unspecified: Secondary | ICD-10-CM | POA: Diagnosis not present

## 2019-11-11 DIAGNOSIS — J322 Chronic ethmoidal sinusitis: Secondary | ICD-10-CM | POA: Diagnosis not present

## 2019-11-11 DIAGNOSIS — J3489 Other specified disorders of nose and nasal sinuses: Secondary | ICD-10-CM | POA: Diagnosis not present

## 2019-11-11 DIAGNOSIS — Q279 Congenital malformation of peripheral vascular system, unspecified: Secondary | ICD-10-CM | POA: Diagnosis not present

## 2019-11-20 ENCOUNTER — Telehealth: Payer: Medicaid Other

## 2019-11-20 DIAGNOSIS — R569 Unspecified convulsions: Secondary | ICD-10-CM | POA: Diagnosis not present

## 2019-11-20 DIAGNOSIS — Z87820 Personal history of traumatic brain injury: Secondary | ICD-10-CM | POA: Diagnosis not present

## 2019-11-20 DIAGNOSIS — G479 Sleep disorder, unspecified: Secondary | ICD-10-CM | POA: Diagnosis not present

## 2019-11-20 DIAGNOSIS — G35 Multiple sclerosis: Secondary | ICD-10-CM | POA: Diagnosis not present

## 2019-11-20 NOTE — Progress Notes (Signed)
Today the history is gathered from:  100% - patient   0% - patient alone today    RECORDS SUMMARY:  No new records on file.     REFERRING PHYSICIAN: [facility] Dr. Harvest Dark  PRIMARY CARE PHYSICIAN:  [facility]    IMPRESSION/PLAN   Kristin Williamson is a 54 y.o. female presenting for evaluation of    MULTIPLE SCLEROSIS /DIFFICULTY WALKING  - Ongoing.   - Patient with MS. Having fatigue and weakness in the legs. No longer taking Topamax. She feels her mood is poor, her mother passed away last month. Not currently taking any MS medication. Has a history of type 1 diabetes.   - Advised that patient bring in her records from her previous LP.   - Patient reports that PT recommended an electric wheelchair for her.   - Reviewed brain MRI with patient, no new lesions identified.   - Continue Adderall 12.5 mg twice daily, refilled.  - Continue taking diazepam 5 mg twice daily as needed, refilled.   - We will consider ordering a brain MRI in about 1 year to evaluate for new lesions.   - We will consider starting Techfidera or Gilenya for MS at next visit.     HISTORY OF TBI/ SEIZURE  - Stable.   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 Most recent seizure was 5 years ago. No longer taking Topamax.   - We will order a 3 hour EEG, as stated below.     RESTLESS LEGS/ SLEEP DIFFICULTY  - Ongoing.   - Patient with restless legs and sleep difficulty. Reports convulsing in her sleep. Has tried a high dose of gabapentin previously and had side effects.   - Reviewed routine EEG, no abnormalities found.   - We will order a 3 hour EEG to evaluate for seizures.   - Start taking Gabapentin 100 mg twice daily for one week, then increase to 200 mg twice daily.  - We will consider increasing gabapentin to 300 mg in the morning and evening, then increasing to 300 mg in the morning and 600 mg at night in the future if symptoms do not improve.       Follow up with Dr. Malvin Johns in 2-3  months    p=4    CHIEF COMPLAINT & HPI   Kristin Williamson is a 54 y.o. female presenting for evaluation of:  Chief Complaint   Patient presents with   . Multiple Sclerosis   . Fatigue      MULTIPLE SCLEROSIS   Patient states that her mother just passed away on 11/19/2022 and she thinks that this is affecting her mood.  States she has had a fall.  She feels fatigued and weak.  Taking adderall 12.5 mg twice a day. She feels she could fall asleep during the day at any point. She reports her sleep is not good due to anxiety. She says "her brain never turns off".  Is off of topamax.  Has had brain MRI and the EEG. She completed a LP in [place] many years ago.      HISTORY OF TBI/ SEIZURE  Patient states that her husband has told her that she shakes in her sleep.  He thinks this is more MS related.    RESTLESS LEGS/ SLEEP DIFFICULTY  Patient still having shaking in her sleep. Her husband says that she has convulsing in her sleep. Completed EEG.  States she is sleeping and  in bed more.  States she feels she is sleeping more but doesn't feel it is good sleep.        DATA SUMMARY:  Brain MRI 11/11/2019  IMPRESSION:   Supratentorial white matter signal abnormalities, compatible with   multiple sclerosis.     No evidence of active demyelination.        09/01/19   Creatinine = 0.8  GFR = 81  Vitamin B12 = 648  A1c = 12.9   Drug screen within normal limits      VISIT SUMMARIES:  11/20/2019: Symptoms ongoing. Continue Adderall 12.5 mg twice daily, refilled. Continue taking diazepam 5 mg twice daily as needed, refilled. We will order a 3 hour EEG to evaluate for seizures.     10/11/19: Patient with MS, history of TBI, seizure, restless legs, and sleep difficulty, new to me. Decrease Topamax by 50 mg every two weeks until discontinued. Continue Adderall 12.5 mg twice daily and   Valium 5 mg twice daily. Schedule brain MRI without contrast to evaluate for MS lesions. Send referral to [facility] to establish care. Send referral for  home health assessment. Schedule routine EEG to evaluate for seizure-like activity.     MEDICATIONS  Outpatient Medications Marked as Taking for the 11/20/19 encounter (Office Visit) with Alphonzo Lemmings, MD   Medication Sig   . albuterol 90 mcg/actuation inhaler Inhale 2 inhalations into the lungs   . amLODIPine (NORVASC) 5 MG tablet Take 5 mg by mouth once daily   . atenoloL (TENORMIN) 50 MG tablet Take 50 mg by mouth nightly   . atorvastatin (LIPITOR) 40 MG tablet Take 40 mg by mouth once daily   . baclofen (LIORESAL) 20 MG tablet Take 20 mg by mouth 2 (two) times daily   . cholecalciferol (VITAMIN D3) 1,250 mcg (50,000 unit) capsule Take 50,000 Units by mouth once a week   . dextroamphetamine-amphetamine (ADDERALL) 12.5 MG tablet Take 1 tablet (12.5 mg total) by mouth 2 (two) times daily   . DULoxetine (CYMBALTA) 30 MG DR capsule Take 30 mg by mouth 2 (two) times daily   . hydrOXYzine (ATARAX) 25 MG tablet Take 25 mg by mouth 3 (three) times daily as needed for Itching   . insulin lispro (HUMALOG JUNIOR KWIKPEN U-100) 100 unit/mL inph Inject subcutaneously 4-18 units three to four times daily   . ipratropium-albuteroL (DUO-NEB) nebulizer solution Take 3 mLs by nebulization once daily as needed for Wheezing   . LANTUS SOLOSTAR U-100 INSULIN pen injector (concentration 100 units/mL) Inject subcutaneously nightly   . levothyroxine (SYNTHROID) 200 MCG tablet Take 200 mcg by mouth once daily Take on an empty stomach with a glass of water at least 30-60 minutes before breakfast.       ALLERGIES  Allergies   Allergen Reactions   . Dpt-Haemophilus Ps(Tet.Conj.) Rash   . Gabapentin Dizziness   . Latex, Natural Rubber Swelling   . Other Rash     SURGICAL STAPLES   . Tramadol Itching         EXAM     Vitals:    11/20/19 1301   BP: (!) 131/96   Pulse: 77   Weight: 94.3 kg (208 lb)   Height: 167.6 cm (5\' 6" )   PainSc:   7   PainLoc: Back     Body mass index is 33.57 kg/m.       GENERAL:  Pleasant female, in no apparent  distress Normocephalic and atraumatic.    MUSCULOSKELETAL:  Bulk -  Obese  Tone - Normal  Pronator Drift - Absent bilaterally.  Ambulation - Gait and station is severely unsteady with the physician, requires 1 assist the entire time.  However, it is noted that patient does not come in with an assistive walking device such as cane or walker and is able to walk in and walk out of the visit without assistance.       PAST MEDICAL HISTORY  Past Medical History:   Diagnosis Date   . Carcinoid syndrome (CMS-HCC)    . COPD (chronic obstructive pulmonary disease) (CMS-HCC)    . Diabetes mellitus without complication (CMS-HCC)    . History of cancer    . Multiple sclerosis (CMS-HCC)    . Seizures (CMS-HCC)    . Von Willebrand disease (CMS-HCC)        PAST SURGICAL HISTORY  Past Surgical History:   Procedure Laterality Date   . APPENDECTOMY     . HYSTERECTOMY VAGINAL     . THYROIDECTOMY TOTAL     . TONSILLECTOMY Bilateral        FAMILY HISTORY  Family History   Problem Relation Age of Onset   . Multiple sclerosis Mother    . Cancer Mother    . Parkinsonism Mother    . High blood pressure (Hypertension) Mother    . Depression Mother    . Alzheimer's disease Father    . Cancer Father    . Diabetes Father    . Diabetes Daughter    . Diabetes Maternal Grandmother        SOCIAL HISTORY   Social History     Tobacco Use   . Smoking status: Current Every Day Smoker     Types: Cigarettes   . Smokeless tobacco: Never Used   Substance Use Topics   . Alcohol use: Not Currently   . Drug use: Not on file         REVIEW OF SYSTEMS:   13 system ROS form was given to the patient to complete and I have reviewed it.  The form was sent for scan to the patient's EHR.  Pertinent positives and negatives are mentioned above in the HPI and all other systems are negative.      DATA   I have personally reviewed all of the data outlined below both prior to the appointment and during the appointment with the patient as appropriate.    No results found for  any previous visit.     11/11/2019  BRAIN MRI W AND WO     EXAM:   MRI HEAD WITHOUT CONTRAST     TECHNIQUE:   Multiplanar, multiecho pulse sequences of the brain and surrounding   structures were obtained without intravenous contrast.     COMPARISON: None.     FINDINGS:   Brain: No diffusion-weighted signal abnormality. No intracranial   hemorrhage. Scattered T2/FLAIR hyperintense foci involving the   periventricular and juxtacortical white matter.     Cerebral volume is within normal limits. Normal appearance of the   corpus callosum. No midline shift, ventriculomegaly or extra-axial   fluid collection. No mass lesion.     Vascular: Normal flow voids. Right occipital developmental venous   anomaly.     Skull and upper cervical spine: Normal marrow signal.     Sinuses/Orbits: Normal orbits. Mild ethmoid sinus mucosal   thickening. No mastoid effusion.     Other: None.     IMPRESSION:   Supratentorial white matter signal abnormalities, compatible with  multiple sclerosis.     No evidence of active demyelination.       Electronically Signed   By: Stana Bunting M.D.   On: 11/11/2019 20:24        No follow-ups on file.    Payor: BCBS Scientist, physiological / Plan: [place] [employer] HEALTHY BLUE / Product Type: Medicaid /       This note is partially prepared by Lily Peer, Scribe, in the presence of and acting as the scribe for Dr. Theora Master.     I have reviewed, edited and added to the note as needed to reflect my best personal medical judgment.        Dr. Theora Master, MD  [facility]  [facility]  [place], [place]  Ph:  [phone]  Fax:  [phone]

## 2019-11-21 DIAGNOSIS — R569 Unspecified convulsions: Secondary | ICD-10-CM | POA: Insufficient documentation

## 2019-11-23 ENCOUNTER — Ambulatory Visit: Payer: Medicaid Other | Admitting: Gastroenterology

## 2019-11-26 ENCOUNTER — Other Ambulatory Visit: Payer: Self-pay | Admitting: Nurse Practitioner

## 2019-11-27 ENCOUNTER — Institutional Professional Consult (permissible substitution): Payer: Medicaid Other | Admitting: Pulmonary Disease

## 2019-11-29 ENCOUNTER — Telehealth: Payer: Self-pay | Admitting: Licensed Clinical Social Worker

## 2019-11-29 ENCOUNTER — Telehealth: Payer: Medicaid Other

## 2019-11-29 NOTE — Telephone Encounter (Signed)
Chronic Care Management    Clinical Social Work General Follow Up Note  11/29/2019 Name: Margaret Church MRN: [id number] DOB: Sep 05, 1965  Margaret Church is a 54 y.o. year old female who is a primary care patient of Cannady, Barbaraann Faster, NP. The CCM team was consulted for assistance with Mental Health Counseling and Resources.   Review of patient status, including review of consultants reports, relevant laboratory and other test results, and collaboration with appropriate care team members and the patient's provider was performed as part of comprehensive patient evaluation and provision of chronic care management services.    LCSW completed CCM outreach attempt today but was unable to reach patient successfully. A HIPPA compliant voice message was left encouraging patient to return call once available. LCSW will ask Scheduling Care Guide to reschedule CCM SW appointment with patient as well.  Outpatient Encounter Medications as of 11/29/2019  Medication Sig  . ACCU-CHEK GUIDE test strip USE TO TEST BLOOD SUGAR FOUR TIMES DAILY  . Accu-Chek Softclix Lancets lancets USE TO TEST BLOOD SUGAR FOUR TIMES DAILY  . albuterol (VENTOLIN HFA) 108 (90 Base) MCG/ACT inhaler Inhale 2 puffs into the lungs every 6 (six) hours as needed for wheezing or shortness of breath.  Marland Kitchen amLODipine (NORVASC) 5 MG tablet Take 1 tablet (5 mg total) by mouth daily.  Marland Kitchen amphetamine-dextroamphetamine (ADDERALL) 12.5 MG tablet Take 12.5 mg by mouth 2 (two) times daily.  Marland Kitchen atenolol (TENORMIN) 50 MG tablet Take 1 tablet (50 mg total) by mouth daily.  Marland Kitchen atorvastatin (LIPITOR) 40 MG tablet Take 1 tablet (40 mg total) by mouth at bedtime.  . baclofen (LIORESAL) 20 MG tablet Take 1 tablet (20 mg total) by mouth 2 (two) times daily.  . Blood Glucose Monitoring Suppl (ACCU-CHEK GUIDE ME) w/Device KIT USE TO TEST BLOOD SUGAR FOUR TIMES DAILY  . Cholecalciferol (VITAMIN D3) 1.25 MG (50000 UT) CAPS TAKE 1 CAPSULE BY MOUTH ONCE A WEEK  FOR  8  WEEKS AND THEN  STOP  RETURN  TO  OFFICE  FOR  LAB  DRAW  . diazepam (VALIUM) 5 MG tablet Take 5 mg by mouth every 6 (six) hours as needed for anxiety.  . DULoxetine (CYMBALTA) 30 MG capsule Take 1 capsule (30 mg total) by mouth 2 (two) times daily.  . Fluticasone-Salmeterol (ADVAIR) 250-50 MCG/DOSE AEPB Inhale 1 puff into the lungs daily.  . hydrOXYzine (ATARAX/VISTARIL) 25 MG tablet Take 1 tablet (25 mg total) by mouth 3 (three) times daily.  . insulin glargine (LANTUS) 100 UNIT/ML Solostar Pen Inject 42 Units into the skin at bedtime.  . insulin lispro (HUMALOG KWIKPEN) 100 UNIT/ML KwikPen Inject 4 to 18 units subcutaneously up to 3-4 times a day.  . ipratropium-albuterol (DUONEB) 0.5-2.5 (3) MG/3ML SOLN Inhale into the lungs as needed.  Marland Kitchen levothyroxine (SYNTHROID) 200 MCG tablet Take 1 tablet (200 mcg total) by mouth daily. With 25 mcg. (total 225 mcg daily)  . levothyroxine (SYNTHROID) 25 MCG tablet Take 25 mcg by mouth daily before breakfast. Take in addition to the 200 mg  . tiZANidine (ZANAFLEX) 4 MG tablet Take 1 tablet (4 mg total) by mouth 3 (three) times daily as needed.  . topiramate (TOPAMAX) 100 MG tablet Take 1 tablet (100 mg total) by mouth 2 (two) times daily.   No facility-administered encounter medications on file as of 11/29/2019.   Follow Up Plan: [place] will reach out to patient to reschedule appointment.   Eula Fried, BSW, MSW, LCSW [facility]  Management [place].[email or web address] Phone: [phone]

## 2019-11-30 ENCOUNTER — Telehealth: Payer: Self-pay

## 2019-11-30 NOTE — Telephone Encounter (Signed)
FYI pt's number has been disconnected

## 2019-11-30 NOTE — Telephone Encounter (Signed)
Copied from [place] [id number]. Topic: General - Inquiry >> Nov 30, 2019  2:39 PM Greggory Keen D wrote: Reason for CRM: Robin with Dr. Bernarda Caffey .  [facility] in [place].  Jolene referred this patient to this office and they are unable to get in touch with this patient to schedule an appt.  They wan to know what they need to do next.

## 2019-12-01 ENCOUNTER — Telehealth: Payer: Self-pay | Admitting: General Practice

## 2019-12-01 ENCOUNTER — Telehealth: Payer: Medicaid Other

## 2019-12-01 NOTE — Telephone Encounter (Signed)
  Chronic Care Management   Outreach Note  12/01/2019 Name: Margaret Church MRN: [id number] DOB: [birth year]  Referred by: Venita Lick, NP Reason for referral : Care Coordination [facility] Initial outreach for Chronic Disease management and Care Coordination Needs)   A second unsuccessful telephone outreach was attempted today. The patient was referred to the case management team for assistance with care management and care coordination. The number called x 2 and it is not a working number. Will see if scheduler can find another number for the patient.   Follow Up Plan: The care management team will reach out to the patient again over the next 30 to 60 days.   Noreene Larsson RN, MSN, [facility] Mobile: [phone]

## 2019-12-04 ENCOUNTER — Other Ambulatory Visit: Payer: Self-pay

## 2019-12-04 ENCOUNTER — Encounter: Payer: Self-pay | Admitting: Gastroenterology

## 2019-12-04 ENCOUNTER — Ambulatory Visit (INDEPENDENT_AMBULATORY_CARE_PROVIDER_SITE_OTHER): Payer: Medicaid Other | Admitting: Gastroenterology

## 2019-12-04 VITALS — BP 160/76 | HR 80 | Temp 97.8°F

## 2019-12-04 DIAGNOSIS — Z85038 Personal history of other malignant neoplasm of large intestine: Secondary | ICD-10-CM | POA: Diagnosis not present

## 2019-12-04 DIAGNOSIS — R1013 Epigastric pain: Secondary | ICD-10-CM | POA: Diagnosis not present

## 2019-12-04 DIAGNOSIS — R140 Abdominal distension (gaseous): Secondary | ICD-10-CM | POA: Diagnosis not present

## 2019-12-04 DIAGNOSIS — R748 Abnormal levels of other serum enzymes: Secondary | ICD-10-CM

## 2019-12-04 DIAGNOSIS — K5909 Other constipation: Secondary | ICD-10-CM | POA: Diagnosis not present

## 2019-12-04 DIAGNOSIS — K8050 Calculus of bile duct without cholangitis or cholecystitis without obstruction: Secondary | ICD-10-CM

## 2019-12-04 DIAGNOSIS — K219 Gastro-esophageal reflux disease without esophagitis: Secondary | ICD-10-CM

## 2019-12-04 DIAGNOSIS — R569 Unspecified convulsions: Secondary | ICD-10-CM | POA: Diagnosis not present

## 2019-12-04 MED ORDER — NA SULFATE-K SULFATE-MG SULF 17.5-3.13-1.6 GM/177ML PO SOLN: 354.0000 mL | Freq: Once | ORAL | 0 refills | Status: AC

## 2019-12-04 MED ORDER — MAGNESIUM CITRATE PO SOLN: 1.0000 | Freq: Once | ORAL | 0 refills | Status: AC

## 2019-12-04 MED ORDER — POLYETHYLENE GLYCOL 3350 17 GM/SCOOP PO POWD: 1.0000 | Freq: Every day | ORAL | 3 refills | Status: AC

## 2019-12-04 NOTE — Patient Instructions (Signed)
Your MRCP is scheduled for 12/17/2019 [facility] [place] [facility] [place] 4:30[place] for a 5pm scan. Nothing to eat or drink after 1pm

## 2019-12-04 NOTE — Progress Notes (Signed)
Cephas Darby, MD [address]  [place]  [place], [place] [address]  Main: [phone]  Fax: [phone]    Gastroenterology Consultation  Referring Provider:     Venita Lick, NP Primary Care Physician:  Venita Lick, NP Primary Gastroenterologist:  Dr. Cephas Darby Reason for Consultation:     Upper abdominal pain, nausea, GERD, chronic constipation, history of colon cancer, stone in common bile duct        HPI:   Margaret Church is a 54 y.o. female referred by Dr. Venita Lick, NP  for consultation & management of constellation of several GI symptoms.  She does have significant past medical history including multiple sclerosis, history of appendicular carcinoid s/p appendectomy, history of colon cancer, s/p colectomy and neoadjuvant chemo, history of thyroid cancer s/p thyroidectomy, insulin-dependent diabetes, steroid-induced cushingoid syndrome, von Willebrand's disease  Upper abdominal pain, nausea: Patient reports chronic history of upper abdominal pain associated with nausea, abdominal bloating.  Upper abdominal pain is mostly concentrated in the right upper quadrant, worse after eating, radiates across abdomen.  She also reports history of heartburn with sensation of food stuck.  She was previously on a PPI.  Currently on none.  She reports having had an upper endoscopy several years ago.  Chronic constipation: She reports having hard bowel movements, associated with significant straining.  She denies rectal bleeding.  She reports trying over-the-counter magnesium citrate, MiraLAX with not much relief.  She is concerned about severe abdominal bloating as well  Colon cancer diagnosed in 2009 s/p colectomy and neoadjuvant chemo.  She reports that she is past due for surveillance colonoscopy.  Bile duct stone: Patient reports that she underwent CT when she was in [facility] few months ago.  This was performed for elevated alkaline phosphatase as  well as upper abdominal pain.  Due to Covid, her procedure was delayed and she did not follow-up with GI until now  NSAIDs: None  Antiplts/Anticoagulants/Anti thrombotics: None  GI Procedures: Colonoscopy more than 5 years ago, reportedly no recurrence of cancer Upper endoscopy several years ago, reportedly normal  Past Medical History:  Diagnosis Date  . Anxiety   . Arthritis   . Asthma   . Cancer ([place])   . Clotting disorder ([place])   . COPD (chronic obstructive pulmonary disease) ([place])   . Depression   . Diabetes mellitus without complication ([place])   . Family history of leukemia   . Family history of lymphoma   . Family history of skin cancer   . Family history of thyroid cancer   . GERD (gastroesophageal reflux disease)   . History of esophageal cancer   . Hyperlipidemia   . Hypertension   . Neuromuscular disorder ([place])   . Oxygen deficiency   . Seizures ([place])   . Sleep apnea   . Thyroid disease     Past Surgical History:  Procedure Laterality Date  . ABDOMINAL HYSTERECTOMY    . APPENDECTOMY    . COLON SURGERY    . HERNIA REPAIR    . KNEE SURGERY    . tonsils surgery      Current Outpatient Medications:  .  ACCU-CHEK GUIDE test strip, USE TO TEST BLOOD SUGAR FOUR TIMES DAILY, Disp: 200 each, Rfl: 0 .  Accu-Chek Softclix Lancets lancets, USE TO TEST BLOOD SUGAR FOUR TIMES DAILY, Disp: 100 each, Rfl: 0 .  albuterol (VENTOLIN HFA) 108 (90 Base) MCG/ACT inhaler, Inhale 2 puffs into the lungs every 6 (six)  hours as needed for wheezing or shortness of breath., Disp: 18 g, Rfl: 4 .  amLODipine (NORVASC) 5 MG tablet, Take 1 tablet (5 mg total) by mouth daily., Disp: 90 tablet, Rfl: 4 .  amphetamine-dextroamphetamine (ADDERALL) 12.5 MG tablet, Take 12.5 mg by mouth 2 (two) times daily., Disp: , Rfl:  .  atenolol (TENORMIN) 50 MG tablet, Take 1 tablet (50 mg total) by mouth daily., Disp: 90 tablet, Rfl: 4 .  atorvastatin (LIPITOR) 40 MG tablet, Take 1 tablet (40 mg total) by  mouth at bedtime., Disp: 90 tablet, Rfl: 4 .  baclofen (LIORESAL) 20 MG tablet, Take 1 tablet (20 mg total) by mouth 2 (two) times daily., Disp: 180 each, Rfl: 4 .  Blood Glucose Monitoring Suppl (ACCU-CHEK GUIDE ME) w/Device KIT, USE TO TEST BLOOD SUGAR FOUR TIMES DAILY, Disp: 1 kit, Rfl: 0 .  Cholecalciferol (VITAMIN D3) 1.25 MG (50000 UT) CAPS, TAKE 1 CAPSULE BY MOUTH ONCE A WEEK FOR  8  WEEKS AND THEN  STOP  RETURN  TO  OFFICE  FOR  LAB  DRAW, Disp: 8 capsule, Rfl: 0 .  diazepam (VALIUM) 5 MG tablet, Take 5 mg by mouth every 6 (six) hours as needed for anxiety., Disp: , Rfl:  .  DULoxetine (CYMBALTA) 30 MG capsule, Take 1 capsule (30 mg total) by mouth 2 (two) times daily., Disp: 180 capsule, Rfl: 4 .  Fluticasone-Salmeterol (ADVAIR) 250-50 MCG/DOSE AEPB, Inhale 1 puff into the lungs daily., Disp: 60 each, Rfl: 4 .  gabapentin (NEURONTIN) 100 MG capsule, , Disp: , Rfl:  .  hydrOXYzine (ATARAX/VISTARIL) 25 MG tablet, Take 1 tablet (25 mg total) by mouth 3 (three) times daily., Disp: 270 tablet, Rfl: 4 .  insulin glargine (LANTUS) 100 UNIT/ML Solostar Pen, Inject 42 Units into the skin at bedtime., Disp: 15 mL, Rfl: 11 .  insulin lispro (HUMALOG KWIKPEN) 100 UNIT/ML KwikPen, Inject 4 to 18 units subcutaneously up to 3-4 times a day., Disp: 15 mL, Rfl: 11 .  ipratropium-albuterol (DUONEB) 0.5-2.5 (3) MG/3ML SOLN, Inhale into the lungs as needed., Disp: , Rfl:  .  levothyroxine (SYNTHROID) 200 MCG tablet, Take 1 tablet (200 mcg total) by mouth daily. With 25 mcg. (total 225 mcg daily), Disp: 90 tablet, Rfl: 4 .  levothyroxine (SYNTHROID) 25 MCG tablet, Take 25 mcg by mouth daily before breakfast. Take in addition to the 200 mg, Disp: , Rfl:  .  tiZANidine (ZANAFLEX) 4 MG tablet, Take 1 tablet (4 mg total) by mouth 3 (three) times daily as needed., Disp: 270 tablet, Rfl: 4 .  magnesium citrate SOLN, Take 296 mLs (1 Bottle total) by mouth once for 1 dose., Disp: 195 mL, Rfl: 0 .  Na Sulfate-K  Sulfate-Mg Sulf 17.5-3.13-1.6 GM/177ML SOLN, Take 354 mLs by mouth once for 1 dose., Disp: 354 mL, Rfl: 0 .  polyethylene glycol powder (GLYCOLAX/MIRALAX) 17 GM/SCOOP powder, Take 255 g by mouth daily., Disp: 255 g, Rfl: 3   Family History  Problem Relation Age of Onset  . Lung cancer Mother 74  . Cancer Mother        skin cancer;   . Heart disease Mother   . Alzheimer's disease Father   . Cancer Father        skin cancer  . Hypertension Father   . Hip fracture Brother   . Hypertension Brother   . Skin cancer Brother   . Lymphoma Paternal Grandmother   . Alcohol abuse Brother   . Hypertension Brother   .  Other Brother        large tumor on thigh removed in [place]  . Multiple myeloma Paternal Uncle   . Von Willebrand disease Paternal Aunt        and carcinoid disease  . Skin cancer Maternal Aunt   . Thyroid cancer Other   . Skin cancer Maternal Aunt   . Cancer Paternal Uncle        unk type  . Liver cancer Paternal Uncle   . Esophageal cancer Paternal Aunt   . Colon polyps Daughter 82     Social History   Tobacco Use  . Smoking status: Current Every Day Smoker    Packs/day: 0.25    Types: Cigarettes  . Smokeless tobacco: Never Used  Vaping Use  . Vaping Use: Never used  Substance Use Topics  . Alcohol use: Never  . Drug use: Not Currently    Allergies as of 12/04/2019 - Review Complete 12/04/2019  Allergen Reaction Noted  . Latex  07/21/2017  . Tetanus antitoxin  09/21/2017  . Gabapentin Rash 07/21/2017  . Other Other (See Comments), Rash, and Swelling 03/28/2018  . Tramadol Rash 07/21/2017    Review of Systems:    All systems reviewed and negative except where noted in HPI.   Physical Exam:  BP (!) 160/76 (BP Location: Left Arm, Patient Position: Sitting, Cuff Size: Normal)   Pulse 80   Temp 97.8 F (36.6 C) (Oral)  No LMP recorded. Patient has had a hysterectomy.  General:   Alert,  Well-developed, well-nourished, pleasant and cooperative in  NAD Head:  Normocephalic and atraumatic. Eyes:  Sclera clear, no icterus.   Conjunctiva pink. Ears:  Normal auditory acuity. Nose:  No deformity, discharge, or lesions. Mouth:  No deformity or lesions,oropharynx pink & moist. Neck:  Supple; no masses or thyromegaly. Lungs:  Respirations even and unlabored.  Clear throughout to auscultation.   No wheezes, crackles, or rhonchi. No acute distress. Heart:  Regular rate and rhythm; no murmurs, clicks, rubs, or gallops. Abdomen:  Normal bowel sounds. Soft, obese, abdominal striae in the lower abdomen, non-tender and severely distended, tympanic to percussion without masses, hepatosplenomegaly or hernias noted.  No guarding or rebound tenderness.   Rectal: Not performed Msk:  Symmetrical without gross deformities. Good, equal movement & strength bilaterally. Pulses:  Normal pulses noted. Extremities:  No clubbing or edema.  No cyanosis. Neurologic:  Alert and oriented x3;  grossly normal neurologically. Skin:  Intact without significant lesions or rashes. No jaundice. Psych:  Alert and cooperative. Normal mood and affect.  Imaging Studies: No abdominal imaging  Assessment and Plan:   Latrisa Hellums is a 54 y.o. female with history of insulin-dependent diabetes, thyroid cancer s/p thyroidectomy, carcinoid of the appendix s/p colectomy, history of colon cancer s/p colectomy neoadjuvant chemo, von Willebrand disease is seen in consultation for symptoms of dyspepsia, bile duct stone based on CT scan, chronic constipation and abdominal bloating  Dyspepsia as well as chronic GERD Recommend upper endoscopy with esophageal, gastric and duodenal biopsies Will obtain recommendation for administration of any preprocedure products from Dr. Rogue Bussing, patient's oncologist given her history of von Willebrand's disease  ?  Bile duct stone, elevated alkaline phosphatase: Patient also reports history of pancreatitis in the past Recommend MRI/MRCP for  further evaluation, if positive, recommend ERCP  History of colon cancer s/p colectomy Recommend colonoscopy Will obtain recommendation for administration of any preprocedure products from Dr. Rogue Bussing, patient's oncologist given her history of von Willebrand's disease  Abdominal  bloating and chronic constipation Recommend MiraLAX bowel prep for bowel cleanout Trial of Linzess 132mg daily, samples provided We will try 2 weeks course of rifaximin for bacterial overgrowth after colonoscopy if bloating is persistent  Follow up in 4 weeks   RCephas Darby MD

## 2019-12-04 NOTE — Procedures (Signed)
[facility], Department of [facility]  Department of Neurology, Neurophysiology Laboratory  [address]   [place], [place] [address]   Office:  [phone] / Fax:  [phone]    EXTENDED IN-OFFICE CONTINUOUSLY MONITORED VIDEO EEG    Name:  Kristin Williamson                            Date of Birth:  1965/11/28    Referring Physician:  Malvin Johns  Account No:  [id number]    Recording Date:   Leads On Tech:  Mandi    Duration:  2 hours 43 minutes    Leads Off Tech:  Mandi  Monitoring Tech: Mandi       Leads On Time: 8:27  Leads Off Time:  11:10 am      The patient's identity was confirmed with two factors.  The EEG order was verified. The procedure, equipment, and anticipated test duration were reviewed with the patient.  Time was allowed for any questions and the patient agreed to proceed with the test.  Twenty-five electrodes were placed and the study was initiated.    CLINICAL HISTORY AND INDICATION/S:  54 y.o. female is referred for EEG due to history of seizures following a TBI in 2009. Continues to have shaking episodes while sleeping.     Epilepsy medications: None.    Current Outpatient Medications:   .  albuterol 90 mcg/actuation inhaler, Inhale 2 inhalations into the lungs, Disp: , Rfl:   .  amLODIPine (NORVASC) 5 MG tablet, Take 5 mg by mouth once daily, Disp: , Rfl:   .  atenoloL (TENORMIN) 50 MG tablet, Take 50 mg by mouth nightly, Disp: , Rfl:   .  atorvastatin (LIPITOR) 40 MG tablet, Take 40 mg by mouth once daily, Disp: , Rfl:   .  baclofen (LIORESAL) 20 MG tablet, Take 20 mg by mouth 2 (two) times daily, Disp: , Rfl:   .  cholecalciferol (VITAMIN D3) 1,250 mcg (50,000 unit) capsule, Take 50,000 Units by mouth once a week, Disp: , Rfl:   .  dextroamphetamine-amphetamine (ADDERALL) 12.5 MG tablet, Take 1 tablet (12.5 mg total) by mouth 2 (two) times daily, Disp: 60 tablet, Rfl: 0  .  diazePAM (VALIUM) 5 MG tablet, Take 1 tablet twice a day as needed, Disp: 60 tablet, Rfl: 0  .  DULoxetine (CYMBALTA)  30 MG DR capsule, Take 30 mg by mouth 2 (two) times daily, Disp: , Rfl:   .  gabapentin (NEURONTIN) 100 MG capsule, Take 100 mg twice a day for one week, then increase to 200 mg(2 tablets) twice a day and continue, Disp: 120 capsule, Rfl: 3  .  hydrOXYzine (ATARAX) 25 MG tablet, Take 25 mg by mouth 3 (three) times daily as needed for Itching, Disp: , Rfl:   .  insulin lispro (HUMALOG JUNIOR KWIKPEN U-100) 100 unit/mL inph, Inject subcutaneously 4-18 units three to four times daily, Disp: , Rfl:   .  ipratropium-albuteroL (DUO-NEB) nebulizer solution, Take 3 mLs by nebulization once daily as needed for Wheezing, Disp: , Rfl:   .  LANTUS SOLOSTAR U-100 INSULIN pen injector (concentration 100 units/mL), Inject subcutaneously nightly, Disp: , Rfl:   .  levothyroxine (SYNTHROID) 200 MCG tablet, Take 200 mcg by mouth once daily Take on an empty stomach with a glass of water at least 30-60 minutes before breakfast., Disp: , Rfl:   .  miscellaneous medical supply Misc, Assessment for power wheelchair  and shower chair, Disp: 1 each, Rfl: 0    REASON:  Evaluate for ictal versus interictal epileptiform activity.  Characterize possible seizure disorder.    TECHNICAL:  Electroencephalogram of the above-mentioned patient was acquired with [employer] equipment.  Electrodes were placed according to the internationally standardized 10-20 system, after individualized distance measurement.  The study was recorded digitally with a bandpass of 1-70 Hz and a sampling rate of at least 200 Hz and was reviewed with the possibility of multiple reformatting.  An EKG channel is utilized.  The patient is monitored on video synchronized to the EEG during this recording and is continuously monitored for the duration of this study.  The patient was continuously monitored by the technologist for the duration of this study.    DESCRIPTION:  The posterior dominant rhythm is a 10 Hz activity that attenuates with eye opening.  The background is comprised  primarily of alpha and beta range frequencies.      The patient enters into stage II sleep as seen by the appearance of sleep spindles.      There are no abnormal activities captured on video.    There are no patient pushbuttons indicating occurrence of a typical event or abnormal behaviors observed by the technologist.    There are no definite ictal or interictal epileptiform activities identified.    IMPRESSION:  This extended in-office continuously monitored video EEG in the awake and asleep states is within normal limits.      Theora Master, MD    I have reviewed, edited and added to the note as needed to reflect my best personal medical judgment.        Dr. Theora Master, MD  [facility]  [facility]  [place], [place]  Ph:  [phone]  Fax:  [phone]      CONTINUOUS MONITORING NOTES:  START: 8:27 am  8:47 am: patient awake, eyes closed, head center, lying back in chair. EEG: awake state with 10 Hz PDR.   8:49 am: patient drowsy. EEG: lateral eye movements  8:57 am: patient asleep, eyes closed, head center, lying back in chair. EEG: stage II sleep characterized by sleep spindles and vertex waves.   9:07 am: patient asleep, eyes closed, head center, lying back in chair. EEG: stage II sleep characterized by sleep spindles and K complexes.   9:17 am: patient sleep, eyes closed, head center, lying back in chair. EEG: slow wave sleep characterized by predominantly delta waves.   9:27 am: patient asleep, eyes closed, head center, lying back in chair. EEG: stage II sleep characterized by sleep spindles and K complexes.  9:37 am: patient asleep, eyes closed, head center, lying back in chair. EEG: stage II sleep characterized by sleep spindles and vertex waves.  9:47 am:  patient asleep, eyes closed, head center, lying back in chair. EEG: stage II sleep characterized by sleep spindles and K complexes.  9:57 am:  patient asleep, eyes closed, head center, lying back in chair. EEG: stage II sleep  characterized by sleep spindles and vertex waves.  10:07 am: patient awake, eyes closed, head leaning right, lying back in chair. EEG: awake state with muscle artifact.   10:17 am: patient asleep, eyes closed, head center, lying back in chair. EEG: stage II sleep characterized by sleep spindles and vertex waves.  10:27 am: patient asleep, eyes closed, head center, lying back in chair. EEG: stage II sleep characterized by sleep spindles and vertex waves.  10:37 am:  patient asleep, eyes closed, head center, lying back in chair. EEG: slow wave sleep characterized by predominantly delta waves.  10:47 am:  patient asleep, eyes closed, head center, lying back in chair. EEG: stage II sleep characterized by sleep spindles and K complexes.  10:57 am: patient asleep, eyes closed, head center, lying back in chair. EEG: stage II sleep characterized by sleep spindles and vertex waves.  11:07 am:  patient asleep, eyes closed, head center, lying back in chair. EEG: stage II sleep characterized by sleep spindles and vertex waves.  END: 11:10 am

## 2019-12-05 ENCOUNTER — Telehealth: Payer: Self-pay | Admitting: Internal Medicine

## 2019-12-05 LAB — BUN+CREAT
BUN/Creatinine Ratio: 13 (ref 9–23)
BUN: 8 mg/dL (ref 6–24)
Creatinine, Ser: 0.6 mg/dL (ref 0.57–1.00)
GFR calc Af Amer: 119 mL/min/1.73
GFR calc non Af Amer: 103 mL/min/1.73

## 2019-12-05 NOTE — Telephone Encounter (Signed)
Thanks for the update and seeing her  RV

## 2019-12-05 NOTE — Telephone Encounter (Signed)
Patient needing hematology clearance prior to colonoscopy [11/06] given history of possible von Willebrand disease.  I reviewed the records from [facility].  Patient will need appointment prior to clearance.  H/T-please inform patient of above  C-schedule appt 10/22--MD; no labs. Thanks GB  FYI-Dr.Vanga

## 2019-12-05 NOTE — Telephone Encounter (Signed)
Tried calling patient 2 times. The call could not go through. It stated "your call could not be completed at this time, please try again later"   Will try again later.

## 2019-12-06 DIAGNOSIS — G2581 Restless legs syndrome: Secondary | ICD-10-CM | POA: Diagnosis not present

## 2019-12-06 DIAGNOSIS — G35 Multiple sclerosis: Secondary | ICD-10-CM | POA: Diagnosis not present

## 2019-12-06 DIAGNOSIS — G479 Sleep disorder, unspecified: Secondary | ICD-10-CM | POA: Diagnosis not present

## 2019-12-06 DIAGNOSIS — G40909 Epilepsy, unspecified, not intractable, without status epilepticus: Secondary | ICD-10-CM | POA: Diagnosis not present

## 2019-12-08 ENCOUNTER — Inpatient Hospital Stay: Payer: Medicaid Other | Attending: Internal Medicine | Admitting: Internal Medicine

## 2019-12-08 ENCOUNTER — Other Ambulatory Visit: Payer: Self-pay

## 2019-12-08 ENCOUNTER — Inpatient Hospital Stay: Payer: Medicaid Other

## 2019-12-08 DIAGNOSIS — Z9049 Acquired absence of other specified parts of digestive tract: Secondary | ICD-10-CM | POA: Diagnosis not present

## 2019-12-08 DIAGNOSIS — G35 Multiple sclerosis: Secondary | ICD-10-CM | POA: Diagnosis not present

## 2019-12-08 DIAGNOSIS — F17210 Nicotine dependence, cigarettes, uncomplicated: Secondary | ICD-10-CM | POA: Diagnosis not present

## 2019-12-08 DIAGNOSIS — E079 Disorder of thyroid, unspecified: Secondary | ICD-10-CM | POA: Insufficient documentation

## 2019-12-08 DIAGNOSIS — Z8349 Family history of other endocrine, nutritional and metabolic diseases: Secondary | ICD-10-CM | POA: Insufficient documentation

## 2019-12-08 DIAGNOSIS — E109 Type 1 diabetes mellitus without complications: Secondary | ICD-10-CM | POA: Diagnosis not present

## 2019-12-08 DIAGNOSIS — D6800 Von Willebrand disease, unspecified: Secondary | ICD-10-CM

## 2019-12-08 DIAGNOSIS — Z800 Family history of malignant neoplasm of digestive organs: Secondary | ICD-10-CM | POA: Insufficient documentation

## 2019-12-08 DIAGNOSIS — Z794 Long term (current) use of insulin: Secondary | ICD-10-CM | POA: Diagnosis not present

## 2019-12-08 DIAGNOSIS — Z811 Family history of alcohol abuse and dependence: Secondary | ICD-10-CM | POA: Insufficient documentation

## 2019-12-08 DIAGNOSIS — M1990 Unspecified osteoarthritis, unspecified site: Secondary | ICD-10-CM | POA: Insufficient documentation

## 2019-12-08 DIAGNOSIS — Z79899 Other long term (current) drug therapy: Secondary | ICD-10-CM | POA: Insufficient documentation

## 2019-12-08 DIAGNOSIS — D751 Secondary polycythemia: Secondary | ICD-10-CM | POA: Diagnosis not present

## 2019-12-08 DIAGNOSIS — Z8249 Family history of ischemic heart disease and other diseases of the circulatory system: Secondary | ICD-10-CM | POA: Diagnosis not present

## 2019-12-08 DIAGNOSIS — F329 Major depressive disorder, single episode, unspecified: Secondary | ICD-10-CM | POA: Insufficient documentation

## 2019-12-08 DIAGNOSIS — Z832 Family history of diseases of the blood and blood-forming organs and certain disorders involving the immune mechanism: Secondary | ICD-10-CM | POA: Diagnosis not present

## 2019-12-08 DIAGNOSIS — Z888 Allergy status to other drugs, medicaments and biological substances status: Secondary | ICD-10-CM | POA: Diagnosis not present

## 2019-12-08 DIAGNOSIS — Z8501 Personal history of malignant neoplasm of esophagus: Secondary | ICD-10-CM | POA: Insufficient documentation

## 2019-12-08 DIAGNOSIS — Z818 Family history of other mental and behavioral disorders: Secondary | ICD-10-CM | POA: Insufficient documentation

## 2019-12-08 DIAGNOSIS — G8929 Other chronic pain: Secondary | ICD-10-CM | POA: Insufficient documentation

## 2019-12-08 DIAGNOSIS — J449 Chronic obstructive pulmonary disease, unspecified: Secondary | ICD-10-CM | POA: Insufficient documentation

## 2019-12-08 DIAGNOSIS — Z808 Family history of malignant neoplasm of other organs or systems: Secondary | ICD-10-CM | POA: Insufficient documentation

## 2019-12-08 DIAGNOSIS — Z801 Family history of malignant neoplasm of trachea, bronchus and lung: Secondary | ICD-10-CM | POA: Insufficient documentation

## 2019-12-08 DIAGNOSIS — M549 Dorsalgia, unspecified: Secondary | ICD-10-CM | POA: Insufficient documentation

## 2019-12-08 DIAGNOSIS — I10 Essential (primary) hypertension: Secondary | ICD-10-CM | POA: Diagnosis not present

## 2019-12-08 DIAGNOSIS — Z885 Allergy status to narcotic agent status: Secondary | ICD-10-CM | POA: Insufficient documentation

## 2019-12-08 DIAGNOSIS — Z8371 Family history of colonic polyps: Secondary | ICD-10-CM | POA: Insufficient documentation

## 2019-12-08 LAB — COMPREHENSIVE METABOLIC PANEL
ALT: 30 U/L (ref 0–44)
AST: 21 U/L (ref 15–41)
Albumin: 3.7 g/dL (ref 3.5–5.0)
Alkaline Phosphatase: 117 U/L (ref 38–126)
Anion gap: 9 (ref 5–15)
BUN: 13 mg/dL (ref 6–20)
CO2: 26 mmol/L (ref 22–32)
Calcium: 8.8 mg/dL — ABNORMAL LOW (ref 8.9–10.3)
Chloride: 99 mmol/L (ref 98–111)
Creatinine, Ser: 0.64 mg/dL (ref 0.44–1.00)
GFR, Estimated: >60 mL/min (ref >60)
Glucose, Bld: 428 mg/dL — ABNORMAL HIGH (ref 70–99)
Potassium: 4 mmol/L (ref 3.5–5.1)
Sodium: 134 mmol/L — ABNORMAL LOW (ref 135–145)
Total Bilirubin: 0.6 mg/dL (ref 0.3–1.2)
Total Protein: 7.2 g/dL (ref 6.5–8.1)

## 2019-12-08 LAB — CBC WITH DIFFERENTIAL/PLATELET
Abs Immature Granulocytes: 0.04 10*3/uL (ref 0.00–0.07)
Basophils Absolute: 0.1 10*3/uL (ref 0.0–0.1)
Basophils Relative: 1 %
Eosinophils Absolute: 0.2 10*3/uL (ref 0.0–0.5)
Eosinophils Relative: 3 %
HCT: 43.5 % (ref 36.0–46.0)
Hemoglobin: 14.5 g/dL (ref 12.0–15.0)
Immature Granulocytes: 1 %
Lymphocytes Relative: 20 %
Lymphs Abs: 1.6 10*3/uL (ref 0.7–4.0)
MCH: 28.4 pg (ref 26.0–34.0)
MCHC: 33.3 g/dL (ref 30.0–36.0)
MCV: 85.1 fL (ref 80.0–100.0)
Monocytes Absolute: 0.5 10*3/uL (ref 0.1–1.0)
Monocytes Relative: 6 %
Neutro Abs: 5.7 10*3/uL (ref 1.7–7.7)
Neutrophils Relative %: 69 %
Platelets: 246 10*3/uL (ref 150–400)
RBC: 5.11 MIL/uL (ref 3.87–5.11)
RDW: 12.6 % (ref 11.5–15.5)
WBC: 8.1 10*3/uL (ref 4.0–10.5)
nRBC: 0 % (ref 0.0–0.2)

## 2019-12-08 LAB — APTT: aPTT: 30 seconds (ref 24–36)

## 2019-12-08 LAB — PROTIME-INR
INR: 1 (ref 0.8–1.2)
Prothrombin Time: 12.4 seconds (ref 11.4–15.2)

## 2019-12-08 LAB — PLATELET FUNCTION ASSAY: Collagen / Epinephrine: 87 seconds (ref 0–193)

## 2019-12-08 NOTE — Progress Notes (Signed)
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   # Hx of colon cancer [2009]- ? Stage II- ? 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   # TAH [at age 14]/ tonsillectomy bleeding [?  Reported diagnosis of von Willebrand]  Oncology History   No history exists.     HISTORY OF PRESENTING ILLNESS:  Margaret Church 54 y.o.  female with a history of multiple malignancies; also with a history of von Willebrand disease -is here for follow-up. Awaiting colonoscopy/upper endoscopy- here for clearance.  Patient denies any easy bruising or bleeding.   Patient denies any blood in stools or black or stools.  Complains of chronic mild to moderate fatigue.  Complains of chronic joint pains.  No recent admission.  States her multiple sclerosis is stable.  Reviewed the [facility] [~200 pages] at length-office notes/labs from 2013- 2016- summarized above.      Review of Systems  Constitutional: Positive for malaise/fatigue. Negative for chills, diaphoresis, fever and weight loss.  HENT: Negative for nosebleeds and sore throat.   Eyes: Negative for double vision.  Respiratory: Negative for cough, hemoptysis, sputum production, shortness of breath and wheezing.   Cardiovascular: Negative for chest pain, palpitations, orthopnea and leg swelling.  Gastrointestinal: Negative for abdominal pain, blood in stool, constipation, diarrhea, heartburn, melena,  nausea and vomiting.  Genitourinary: Negative for dysuria, frequency and urgency.  Musculoskeletal: Positive for back pain and joint pain.  Skin: Negative.  Negative for itching and rash.  Neurological: Negative for dizziness, tingling, focal weakness, weakness and headaches.  Endo/Heme/Allergies: Bruises/bleeds easily.  Psychiatric/Behavioral: Negative for depression. The patient is not nervous/anxious and does not have insomnia.      MEDICAL HISTORY:  Past Medical History:  Diagnosis Date  . Anxiety   . Arthritis   . Asthma   . Cancer ([place])   . Clotting disorder ([place])   . COPD (chronic obstructive pulmonary disease) ([place])   . Depression   . Diabetes mellitus without complication ([place])   . Family history of leukemia   . Family history of lymphoma   . Family history of skin cancer   . Family history of thyroid cancer   . GERD (gastroesophageal reflux disease)   . History of esophageal cancer   . Hyperlipidemia   . Hypertension   . Neuromuscular disorder ([place])   . Oxygen deficiency   . Seizures ([place])   . Sleep apnea   . Thyroid disease     SURGICAL HISTORY: Past Surgical History:  Procedure Laterality Date  . ABDOMINAL HYSTERECTOMY    . APPENDECTOMY    . COLON SURGERY    . HERNIA REPAIR    . KNEE SURGERY    . tonsils surgery      SOCIAL HISTORY: Social History   Socioeconomic History  . Marital status: Significant Other    Spouse name: Not on file  . Number of children: Not on file  . Years of education: Not on file  . Highest education level: Not on file  Occupational History  . Not on file  Tobacco Use  . Smoking status: Current Every Day Smoker    Packs/day: 0.25    Types: Cigarettes  . Smokeless tobacco: Never Used  Vaping Use  . Vaping Use: Never used  Substance and Sexual Activity  . Alcohol use: Never  . Drug use: Not Currently  . Sexual activity: Yes  Other Topics Concern  . 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 [place]oved [place] closer to family ~ [place] years ago [place] Social smoker 4-5 cig/ day; no alcohol; hx of marijuana/ palliative care [[place]]   Social Determinants of Health   Financial Resource Strain:   . Difficulty of Paying Living Expenses: Not on file  Food Insecurity:   . Worried About Charity fundraiser in the Last Year: Not on file  . Ran Out of Food in the Last Year: Not on file  Transportation Needs:   . Lack of Transportation (Medical): Not on file  . Lack of Transportation (Non-Medical): Not on file  Physical Activity:   . Days of Exercise per Week: Not on file  . Minutes of Exercise per Session: Not on file  Stress:   . Feeling of Stress : Not on file  Social Connections:   . Frequency of Communication with Friends and Family: Not on file  . Frequency of Social Gatherings with Friends and Family: Not on file  . Attends Religious Services: Not on file  . Active Member of Clubs or Organizations: Not on file  . Attends Archivist Meetings: Not on file  . Marital Status: Not on file  Intimate Partner Violence:   . Fear of Current or Ex-Partner: Not on file  . Emotionally Abused: Not on file  . Physically Abused: Not on file  . Sexually Abused: Not on file    FAMILY HISTORY: Family History  Problem Relation Age of Onset  . Lung cancer Mother 107  . Cancer Mother        skin cancer;   . Heart disease Mother   . Alzheimer's disease Father   . Cancer Father        skin cancer  . Hypertension Father   . Hip fracture Brother   . Hypertension Brother   . Skin cancer Brother   . Lymphoma Paternal Grandmother   . Alcohol abuse Brother   . Hypertension Brother   . Other Brother        large tumor on thigh removed in [place]  . Multiple myeloma Paternal Uncle   . Von Willebrand disease Paternal Aunt        and carcinoid disease  . Skin cancer Maternal Aunt   . Thyroid cancer Other   .  Skin cancer Maternal Aunt   . Cancer Paternal Uncle        unk type  . Liver cancer Paternal Uncle   . Esophageal cancer Paternal Aunt   . Colon polyps Daughter 78    ALLERGIES:  is allergic to latex, tetanus antitoxin, gabapentin, other, and tramadol.  MEDICATIONS:  Current Outpatient Medications  Medication Sig Dispense Refill  . ACCU-CHEK GUIDE test strip USE TO TEST BLOOD SUGAR FOUR TIMES DAILY 200 each 0  . Accu-Chek Softclix Lancets lancets USE TO TEST BLOOD SUGAR FOUR TIMES DAILY 100 each 0  . albuterol (VENTOLIN HFA) 108 (90 Base) MCG/ACT inhaler Inhale 2 puffs  into the lungs every 6 (six) hours as needed for wheezing or shortness of breath. 18 g 4  . amLODipine (NORVASC) 5 MG tablet Take 1 tablet (5 mg total) by mouth daily. 90 tablet 4  . amphetamine-dextroamphetamine (ADDERALL) 12.5 MG tablet Take 12.5 mg by mouth 2 (two) times daily.    Marland Kitchen atenolol (TENORMIN) 50 MG tablet Take 1 tablet (50 mg total) by mouth daily. 90 tablet 4  . atorvastatin (LIPITOR) 40 MG tablet Take 1 tablet (40 mg total) by mouth at bedtime. 90 tablet 4  . baclofen (LIORESAL) 20 MG tablet Take 1 tablet (20 mg total) by mouth 2 (two) times daily. 180 each 4  . Blood Glucose Monitoring Suppl (ACCU-CHEK GUIDE ME) w/Device KIT USE TO TEST BLOOD SUGAR FOUR TIMES DAILY 1 kit 0  . Cholecalciferol (VITAMIN D3) 1.25 MG (50000 UT) CAPS TAKE 1 CAPSULE BY MOUTH ONCE A WEEK FOR  8  WEEKS AND THEN  STOP  RETURN  TO  OFFICE  FOR  LAB  DRAW 8 capsule 0  . diazepam (VALIUM) 5 MG tablet Take 5 mg by mouth every 6 (six) hours as needed for anxiety.    . DULoxetine (CYMBALTA) 30 MG capsule Take 1 capsule (30 mg total) by mouth 2 (two) times daily. 180 capsule 4  . Fluticasone-Salmeterol (ADVAIR) 250-50 MCG/DOSE AEPB Inhale 1 puff into the lungs daily. 60 each 4  . gabapentin (NEURONTIN) 100 MG capsule     . hydrOXYzine (ATARAX/VISTARIL) 25 MG tablet Take 1 tablet (25 mg total) by mouth 3 (three) times daily. 270 tablet 4  .  insulin glargine (LANTUS) 100 UNIT/ML Solostar Pen Inject 42 Units into the skin at bedtime. 15 mL 11  . insulin lispro (HUMALOG KWIKPEN) 100 UNIT/ML KwikPen Inject 4 to 18 units subcutaneously up to 3-4 times a day. 15 mL 11  . ipratropium-albuterol (DUONEB) 0.5-2.5 (3) MG/3ML SOLN Inhale into the lungs as needed.    Marland Kitchen levothyroxine (SYNTHROID) 200 MCG tablet Take 1 tablet (200 mcg total) by mouth daily. With 25 mcg. (total 225 mcg daily) 90 tablet 4  . levothyroxine (SYNTHROID) 25 MCG tablet Take 25 mcg by mouth daily before breakfast. Take in addition to the 200 mg    . polyethylene glycol powder (GLYCOLAX/MIRALAX) 17 GM/SCOOP powder Take 255 g by mouth daily. 255 g 3  . tiZANidine (ZANAFLEX) 4 MG tablet Take 1 tablet (4 mg total) by mouth 3 (three) times daily as needed. 270 tablet 4   No current facility-administered medications for this visit.      Marland Kitchen  PHYSICAL EXAMINATION: ECOG PERFORMANCE STATUS: 0 - Asymptomatic  Vitals:   12/08/19 1004  BP: 135/77  Pulse: 74  Resp: 16  Temp: (!) 96 F (35.6 C)  SpO2: 96%   There were no vitals filed for this visit.  Physical Exam HENT:     Head: Normocephalic and atraumatic.     Mouth/Throat:     Pharynx: No oropharyngeal exudate.  Eyes:     Pupils: Pupils are equal, round, and reactive to light.  Cardiovascular:     Rate and Rhythm: Normal rate and regular rhythm.  Pulmonary:     Effort: No respiratory distress.     Breath sounds: No wheezing.  Abdominal:     General: Bowel sounds are normal. There is no distension.     Palpations: Abdomen is soft. There is no mass.     Tenderness: There is no abdominal tenderness. There is no guarding or rebound.  Musculoskeletal:        General: No tenderness. Normal range of motion.     Cervical back: Normal range of motion and neck supple.  Skin:    General: Skin is warm.  Neurological:     Mental Status: She is alert and oriented to person, place, and time.  Psychiatric:        Mood  and Affect: Affect normal.      LABORATORY DATA:  I have reviewed the data as listed Lab Results  Component Value Date   WBC 8.1 12/08/2019   HGB 14.5 12/08/2019   HCT 43.5 12/08/2019   MCV 85.1 12/08/2019   PLT 246 12/08/2019   Recent Labs    09/01/19 1104 09/01/19 1104 09/11/19 1155 12/04/19 1426 12/08/19 1039  NA 132*  --  135  --  134*  K 4.3  --  4.0  --  4.0  CL 93*  --  102  --  99  CO2 22  --  23  --  26  GLUCOSE 367*  --  387*  --  428*  BUN 11  --  _0 CREATININE 0.82   < > 0.73 0.60 0.64  CALCIUM 9.7  --  9.1  --  8.8*  GFRNONAA 81   < > >60 103 >60  GFRAA 93  --  >60 119  --   PROT 7.7  --  8.4*  --  7.2  ALBUMIN 4.8  --  4.2  --  3.7  AST 15  --  16  --  21  ALT 23  --  27  --  30  ALKPHOS 188*  --  162*  --  117  BILITOT 0.6  --  0.7  --  0.6   < > = values in this interval not displayed.    RADIOGRAPHIC STUDIES: I have personally reviewed the radiological images as listed and agreed with the findings in the report. MR BRAIN WO CONTRAST  Result Date: 11/11/2019 CLINICAL DATA:  Multiple sclerosis EXAM: MRI HEAD WITHOUT CONTRAST TECHNIQUE: Multiplanar, multiecho pulse sequences of the brain and surrounding structures were obtained without intravenous contrast. COMPARISON:  None. FINDINGS: Brain: No diffusion-weighted signal abnormality. No intracranial hemorrhage. Scattered T2/FLAIR hyperintense foci involving the periventricular and juxtacortical white matter. Cerebral volume is within normal limits. Normal appearance of the corpus callosum. No midline shift, ventriculomegaly or extra-axial fluid collection. No mass lesion. Vascular: Normal flow voids. Right occipital developmental venous anomaly. Skull and upper cervical spine: Normal marrow signal. Sinuses/Orbits: Normal orbits. Mild ethmoid sinus mucosal thickening. No mastoid effusion. Other: None. IMPRESSION: Supratentorial white matter signal abnormalities, compatible with multiple sclerosis. No  evidence of active demyelination. Electronically Signed   By: Primitivo Gauze M.D.   On: 11/11/2019 20:24    ASSESSMENT & PLAN:   Von Willebrand disease ([place]) # VWD- as reported by pt-extensive review of records [2013-2016]-no excessive bleeding noted post surgical interventions.  # Patient had colectomy [2005]/thyroidectomy [2014] without any excessive bleeding.  I have reordered bleeding diathesis work-up for a clearance for colonoscopy.  Clinically I would not concerned about bleeding diathesis.  Discussed with the patient in agreement.  However will wait for the work-up to be available prior to clearance.  # Erythrocytosis- Hb 15/HCT 17; clinically secondary; JAK2-NEGATIVE.  Stable.  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  No clinical evidence of disease.  # Hx of colon cancer [2009]- ? Stage II-CEA normal.  Clinically no  evidence of recurrence.  Awaiting GI evaluation end of this month/surveillance colonoscopy.  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  Clinically NO evidence of recurrence.  Thyroid markers- negative.  TSH-0.3; normal T4/T3 [suppressive dose of Synthroid; goal TSH unclear].  Continue Synthroid at the current dose.   # Genetics: Given the multiple malignancies; patient has 4 children; s/p genetic counseling negative for any pathogenic mutations.  NEG.   #Multiple sclerosis-stable.  Followed by PCP/neurology..  DISPOSITION:will call # Labs today- ordered # keep appt as planned- in 2022-Dr.  Cc: Jolene Cannady/Dr.Vanga   All questions were answered. The patient knows to call the clinic with any problems, questions or concerns.    Cammie Sickle, MD 12/08/2019 3:58 PM

## 2019-12-08 NOTE — Progress Notes (Signed)
Has a COPD flare. Has been going on for 2 weeks.

## 2019-12-08 NOTE — Assessment & Plan Note (Addendum)
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  # Patient had colectomy [2005]/thyroidectomy [2014] without any excessive bleeding.  I have reordered bleeding diathesis work-up for a clearance for colonoscopy.  Clinically I would not concerned about bleeding diathesis.  Discussed with the patient in agreement.  However will wait for the work-up to be available prior to clearance.  # Erythrocytosis- Hb 15/HCT 17; clinically secondary; JAK2-NEGATIVE.  Stable.  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  No clinical evidence of disease.  # Hx of colon cancer [2009]- ? Stage II-CEA normal.  Clinically no evidence of recurrence.  Awaiting GI evaluation end of this month/surveillance colonoscopy.  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  Clinically NO evidence of recurrence.  Thyroid markers- negative.  TSH-0.3; normal T4/T3 [suppressive dose of Synthroid; goal TSH unclear].  Continue Synthroid at the current dose.   # Genetics: Given the multiple malignancies; patient has 4 children; s/p genetic counseling negative for any pathogenic mutations.  NEG.   #Multiple sclerosis-stable.  Followed by PCP/neurology..  DISPOSITION:will call # Labs today- ordered # keep appt as planned- in 2022-Dr.  Cc: Jolene Cannady/Dr.Vanga

## 2019-12-11 ENCOUNTER — Other Ambulatory Visit: Payer: Self-pay

## 2019-12-11 LAB — VON WILLEBRAND PANEL
Coagulation Factor VIII: 224 % — ABNORMAL HIGH (ref 56–140)
Ristocetin Co-factor, Plasma: 141 % (ref 50–200)
Von Willebrand Antigen, Plasma: 180 % (ref 50–200)

## 2019-12-11 LAB — COAG STUDIES INTERP REPORT

## 2019-12-11 MED ORDER — FLUTICASONE-SALMETEROL 250-50 MCG/DOSE IN AEPB: 1.0000 | INHALATION_SPRAY | Freq: Every day | RESPIRATORY_TRACT | 4 refills | Status: AC

## 2019-12-13 ENCOUNTER — Telehealth: Payer: Self-pay

## 2019-12-13 ENCOUNTER — Telehealth: Payer: Self-pay | Admitting: Internal Medicine

## 2019-12-13 NOTE — Telephone Encounter (Signed)
On 10/26-left a detailed voicemail for the patient that her von Willebrand levels/and other bleeding diathesis panel-all within normal limits.    Also reviewed her previous records- no clinical concerns for any obvious bleeding diathesis.  We will provide clearance for her colonoscopy.   FYI-Dr.Vanga.

## 2019-12-13 NOTE — Telephone Encounter (Signed)
No need of any blood products pre or post colonoscopy  RV

## 2019-12-17 ENCOUNTER — Ambulatory Visit: Payer: Medicaid Other

## 2019-12-19 ENCOUNTER — Other Ambulatory Visit: Payer: Self-pay

## 2019-12-19 ENCOUNTER — Other Ambulatory Visit
Admission: RE | Admit: 2019-12-19 | Discharge: 2019-12-19 | Disposition: A | Discharge status: Home / Self Care | Payer: Medicaid Other | Source: Ambulatory Visit | Attending: Gastroenterology | Admitting: Gastroenterology

## 2019-12-19 DIAGNOSIS — Z20822 Contact with and (suspected) exposure to covid-19: Secondary | ICD-10-CM | POA: Insufficient documentation

## 2019-12-19 DIAGNOSIS — Z01812 Encounter for preprocedural laboratory examination: Secondary | ICD-10-CM | POA: Diagnosis not present

## 2019-12-19 LAB — SARS CORONAVIRUS 2 (TAT 6-24 HRS): SARS Coronavirus 2: NEGATIVE

## 2019-12-20 ENCOUNTER — Encounter: Payer: Self-pay | Admitting: Pulmonary Disease

## 2019-12-20 ENCOUNTER — Encounter: Payer: Self-pay | Admitting: Gastroenterology

## 2019-12-20 ENCOUNTER — Ambulatory Visit (INDEPENDENT_AMBULATORY_CARE_PROVIDER_SITE_OTHER): Payer: Medicaid Other | Admitting: Pulmonary Disease

## 2019-12-20 VITALS — BP 132/70 | HR 82 | Temp 96.9°F | Ht 65.5 in | Wt 208.0 lb

## 2019-12-20 DIAGNOSIS — F17210 Nicotine dependence, cigarettes, uncomplicated: Secondary | ICD-10-CM

## 2019-12-20 DIAGNOSIS — G4733 Obstructive sleep apnea (adult) (pediatric): Secondary | ICD-10-CM | POA: Diagnosis not present

## 2019-12-20 DIAGNOSIS — R0602 Shortness of breath: Secondary | ICD-10-CM

## 2019-12-20 MED ORDER — SPIRIVA RESPIMAT 2.5 MCG/ACT IN AERS
2.0000 | INHALATION_SPRAY | Freq: Every day | RESPIRATORY_TRACT | 11 refills | Status: DC
Start: 2020-01-16 — End: 2020-10-08

## 2019-12-20 MED ORDER — SPIRIVA RESPIMAT 2.5 MCG/ACT IN AERS: 2.0000 | INHALATION_SPRAY | Freq: Every day | RESPIRATORY_TRACT | 0 refills | Status: DC

## 2019-12-20 MED ORDER — ALBUTEROL SULFATE (2.5 MG/3ML) 0.083% IN NEBU
2.5000 mg | INHALATION_SOLUTION | Freq: Four times a day (QID) | RESPIRATORY_TRACT | 12 refills | Status: AC | PRN
Start: 2019-12-20 — End: ?

## 2019-12-20 NOTE — Progress Notes (Signed)
Subjective:    Patient ID: Margaret Church, female    DOB: [birth year], 54 y.o.   MRN: [id number]  HPI Patient is a 54 year old very complex, current smoker (4 cigarettes/day) with a history as noted below who presents for evaluation of COPD.  She is kindly referred by Marnee Guarneri, NP.  Patient notes dyspnea on any exertion at home.  She also notes cough productive of copious amounts of sputum.  She had previously lived in [place] in [place] and has relocated here approximately 8 months ago.  She has a history of multiple sclerosis, carcinoid tumor and von Willebrand's disease.  She also has been diagnosed with COPD.  We have no spirometry is available or prior PFTs.  Patient also notes issues with "jerking" at nighttime when she sleeps, heavy snoring and occasional apneic episodes.  She has been diagnosed with sleep apnea previously but is currently not on any CPAP because she could not tolerate this.  It does not appear that a CPAP desensitization was not done.  She is currently maintained on Advair 250/50 which works somewhat but does not alleviate all of her symptoms of productive cough and dyspnea.  She has tried Spiriva previously but did not feel that this helped however did not understand that this was a slow acting medication and a maintenance medication.  She appears very deconditioned due to her underlying multiple sclerosis.  She is being followed by neurology with regards to her MS.  She has not had any orthopnea or paroxysmal nocturnal dyspnea.  No lower extremity edema.  No chest pain or GERD reported.   Review of Systems A 10 point review of systems was performed and it is as noted above otherwise negative.  Past Medical History:  Diagnosis Date  . Anxiety   . Arthritis   . Asthma   . Cancer ([place])   . Clotting disorder ([place])   . COPD (chronic obstructive pulmonary disease) ([place])   . Depression   . Diabetes mellitus without complication ([place])   . Family  history of leukemia   . Family history of lymphoma   . Family history of skin cancer   . Family history of thyroid cancer   . GERD (gastroesophageal reflux disease)   . History of esophageal cancer   . Hyperlipidemia   . Hypertension   . Neuromuscular disorder ([place])   . Oxygen deficiency   . Seizures ([place])   . Sleep apnea   . Thyroid disease    Patient Active Problem List   Diagnosis Date Noted  . Seizure-like activity ([place]) 11/21/2019  . Vitamin D deficiency 10/27/2019  . Genetic testing 10/16/2019  . Family history of skin cancer   . Family history of leukemia   . Family history of lymphoma   . Family history of thyroid cancer   . Type 1 diabetes mellitus with obesity ([place]) 09/01/2019  . Hypertension associated with diabetes ([place]) 09/01/2019  . Long-term current use of benzodiazepine 09/01/2019  . History of bulimia nervosa 09/01/2019  . Seizure disorder ([place]) 09/01/2019  . Poor dentition 09/01/2019  . Dental abscess 09/01/2019  . Nicotine dependence, cigarettes, w unsp disorders 09/01/2019  . Chronic obstructive pulmonary disease ([place]) 07/21/2017  . Chronic pain 07/21/2017  . Costochondritis 07/21/2017  . Eczema 07/21/2017  . History of appendectomy 07/21/2017  . History of malignant neoplasm of colon 07/21/2017  . History of malignant neoplasm of thyroid 07/21/2017  . Depression, recurrent ([place]) 07/21/2017  . Multiple sclerosis ([place]) 07/21/2017  .  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 Von Willebrand disease ([place]) 07/21/2017   Past Surgical History:  Procedure Laterality Date  . ABDOMINAL HYSTERECTOMY    . APPENDECTOMY    . COLON SURGERY    . HERNIA REPAIR    . KNEE SURGERY    . tonsils surgery     Family History  Problem Relation Age of Onset  . Lung cancer Mother 74  . Cancer Mother        skin cancer;   . Heart disease Mother   . Alzheimer's disease Father   . Cancer Father        skin cancer  . Hypertension Father   . Hip fracture Brother   .  Hypertension Brother   . Skin cancer Brother   . Lymphoma Paternal Grandmother   . Alcohol abuse Brother   . Hypertension Brother   . Other Brother        large tumor on thigh removed in [place]  . Multiple myeloma Paternal Uncle   . Von Willebrand disease Paternal Aunt        and carcinoid disease  . Skin cancer Maternal Aunt   . Thyroid cancer Other   . Skin cancer Maternal Aunt   . Cancer Paternal Uncle        unk type  . Liver cancer Paternal Uncle   . Esophageal cancer Paternal Aunt   . Colon polyps Daughter 73   Social History   Tobacco Use  . Smoking status: Current Every Day Smoker    Packs/day: 0.50    Years: 8.00    Pack years: 4.00    Types: Cigarettes  . Smokeless tobacco: Never Used  . Tobacco comment: 4 Cigs daily--12/20/2019  Substance Use Topics  . Alcohol use: Never   Allergies  Allergen Reactions  . Latex     Other reaction(s): Hive  . Tetanus Antitoxin     Other reaction(s): Arm Swelling, Fever, N/V  . Other Other (See Comments), Rash and Swelling    SURGICAL STAPLES CAUSE LOCAL SWELLING  . Tramadol Rash   Current Meds  Medication Sig  . ACCU-CHEK GUIDE test strip USE TO TEST BLOOD SUGAR FOUR TIMES DAILY  . Accu-Chek Softclix Lancets lancets USE TO TEST BLOOD SUGAR FOUR TIMES DAILY  . albuterol (VENTOLIN HFA) 108 (90 Base) MCG/ACT inhaler Inhale 2 puffs into the lungs every 6 (six) hours as needed for wheezing or shortness of breath.  Marland Kitchen amLODipine (NORVASC) 5 MG tablet Take 1 tablet (5 mg total) by mouth daily.  Marland Kitchen amphetamine-dextroamphetamine (ADDERALL) 12.5 MG tablet Take 12.5 mg by mouth 2 (two) times daily.  Marland Kitchen atenolol (TENORMIN) 50 MG tablet Take 1 tablet (50 mg total) by mouth daily.  Marland Kitchen atorvastatin (LIPITOR) 40 MG tablet Take 1 tablet (40 mg total) by mouth at bedtime.  . baclofen (LIORESAL) 20 MG tablet Take 1 tablet (20 mg total) by mouth 2 (two) times daily.  . Blood Glucose Monitoring Suppl (ACCU-CHEK GUIDE ME) w/Device KIT USE TO TEST  BLOOD SUGAR FOUR TIMES DAILY  . Cholecalciferol (VITAMIN D3) 1.25 MG (50000 UT) CAPS TAKE 1 CAPSULE BY MOUTH ONCE A WEEK FOR  8  WEEKS AND THEN  STOP  RETURN  TO  OFFICE  FOR  LAB  DRAW  . diazepam (VALIUM) 5 MG tablet Take 5 mg by mouth every 6 (six) hours as needed for anxiety.  . DULoxetine (CYMBALTA) 30 MG capsule Take 1 capsule (30 mg total) by mouth 2 (two) times daily.  Marland Kitchen  Fluticasone-Salmeterol (ADVAIR) 250-50 MCG/DOSE AEPB Inhale 1 puff into the lungs daily.  Marland Kitchen gabapentin (NEURONTIN) 100 MG capsule   . hydrOXYzine (ATARAX/VISTARIL) 25 MG tablet Take 1 tablet (25 mg total) by mouth 3 (three) times daily.  . insulin glargine (LANTUS) 100 UNIT/ML Solostar Pen Inject 42 Units into the skin at bedtime.  . insulin lispro (HUMALOG KWIKPEN) 100 UNIT/ML KwikPen Inject 4 to 18 units subcutaneously up to 3-4 times a day.  . levothyroxine (SYNTHROID) 200 MCG tablet Take 1 tablet (200 mcg total) by mouth daily. With 25 mcg. (total 225 mcg daily)  . levothyroxine (SYNTHROID) 25 MCG tablet Take 25 mcg by mouth daily before breakfast. Take in addition to the 200 mg  . polyethylene glycol powder (GLYCOLAX/MIRALAX) 17 GM/SCOOP powder Take 255 g by mouth daily.  Marland Kitchen tiZANidine (ZANAFLEX) 4 MG tablet Take 1 tablet (4 mg total) by mouth 3 (three) times daily as needed.  . [DISCONTINUED] ipratropium-albuterol (DUONEB) 0.5-2.5 (3) MG/3ML SOLN Inhale into the lungs as needed.       Objective:   Physical Exam BP 132/70 (BP Location: Left Arm, Cuff Size: Normal)   Pulse 82   Temp (!) 96.9 F (36.1 C) (Temporal)   Ht 5' 5.5" (1.664 m)   Wt 208 lb (94.3 kg) Comment: WEIGHT IS PER PT  SpO2 97%   BMI 34.09 kg/m  GENERAL: Overweight woman, presents in wheelchair.  No acute distress. HEAD: Normocephalic, atraumatic.  EYES: Mild ptosis of the right eye.  Pupils equal, round, reactive to light.  No scleral icterus.  MOUTH: Nose/mouth/throat not examined due to masking requirements for COVID 19. NECK: Supple. No  thyromegaly. Trachea midline. No JVD.  No adenopathy. PULMONARY: Good air entry bilaterally.  Coarse breath sounds otherwise, no adventitious sounds. CARDIOVASCULAR: S1 and S2. Regular rate and rhythm.  No rubs, murmurs or gallops heard. ABDOMEN: Obese nondistended. MUSCULOSKELETAL: Decreased muscle mass lower extremities, no edema. NEUROLOGIC: Very unsteady gait baseline, presents in transport chair.  Speech is fluent. SKIN: Intact,warm,dry. PSYCH: Depressed mood, normal behavior.      Assessment & Plan:     ICD-10-CM   1. Shortness of breath  R06.02 Pulmonary Function Test [facility] Only   Likely multifactorial element of deconditioning/muscle weakness Cannot exclude bronchitis/asthmatic bronchitis PFTs with inspiratory and expiratory pressures  2. OSA (obstructive sleep apnea)  G47.33 Split night study   By history and by symptomatology the patient appears to have significant sleep apnea We will obtain an split-night study Suspect that she will need AVAPS  3. Light cigarette smoker  F17.210    She has been encouraged to discontinue cigarettes altogether.   Orders Placed This Encounter  Procedures  . Pulmonary Function Test [facility] Only    inspiration and expiration breath    Standing Status:   Future    Standing Expiration Date:   12/19/2020    Scheduling Instructions:     Next available.    Order Specific Question:   Full PFT: includes the following: basic spirometry, spirometry pre & post bronchodilator, diffusion capacity (DLCO), lung volumes    Answer:   Full PFT  . Split night study    Standing Status:   Future    Standing Expiration Date:   12/19/2020    Order Specific Question:   Where should this test be performed:    Answer:   [facility]   Meds ordered this encounter  Medications  . Tiotropium Bromide Monohydrate (SPIRIVA RESPIMAT) 2.5 MCG/ACT AERS    Sig: Inhale 2 puffs  into the lungs daily.    Dispense:  8 g    Refill:  0    Order Specific Question:   Lot Number?     Answer:   [id number]    Order Specific Question:   Expiration Date?    Answer:   07/17/2021    Order Specific Question:   Quantity    Answer:   2  . albuterol (PROVENTIL) (2.5 MG/3ML) 0.083% nebulizer solution    Sig: Take 3 mLs (2.5 mg total) by nebulization every 6 (six) hours as needed for wheezing or shortness of breath.    Dispense:  75 mL    Refill:  12  . Tiotropium Bromide Monohydrate (SPIRIVA RESPIMAT) 2.5 MCG/ACT AERS    Sig: Inhale 2 puffs into the lungs daily.    Dispense:  4 g    Refill:  11   Discussion:  Patient is exhibiting shortness of breath that is likely multifactorial she does have evidence of significant muscle weakness and likely deconditioning.  In addition she may have issues with chronic bronchitis/asthmatic bronchitis.  She is currently only somewhat relieved by Advair.  We will reintroduce Spiriva in the form of Respimat, she previously was trying to use HandiHaler but was having difficulty with the device.  Samples were provided for the patient and prescription sent to her pharmacy.  We also sent prescription for her albuterol as rescue via nebulizer.  PFTs will help [place] sort out the issue with dyspnea.  She has been having issues with sleep disturbance.  We will proceed with split-night sleep study.  I suspect that she may need at the very minimum AVAPS as I suspect she has significant respiratory muscle weakness.  We will see her in follow-up in 3 months time she is to call us prior to that time should any new difficulties arise.  Renold Don, MD [facility]   *This note was dictated using voice recognition software/Dragon.  Despite best efforts to proofread, errors can occur which can change the meaning.  Any change was purely unintentional.

## 2019-12-20 NOTE — Patient Instructions (Signed)
We will be getting breathing tests.  We will be getting sleep study.  We have added Spiriva to your medication list.  Sending a prescription for your rescue nebulizer medicine.  We will see you in follow-up in 3 months time call sooner should any new problems arise

## 2019-12-21 ENCOUNTER — Ambulatory Visit: Payer: Medicaid Other | Admitting: Certified Registered"

## 2019-12-21 ENCOUNTER — Encounter: Payer: Self-pay | Admitting: Gastroenterology

## 2019-12-21 ENCOUNTER — Other Ambulatory Visit: Payer: Self-pay

## 2019-12-21 ENCOUNTER — Ambulatory Visit
Admission: RE | Admit: 2019-12-21 | Discharge: 2019-12-21 | Disposition: A | Discharge status: Home / Self Care | Payer: Medicaid Other | Attending: Gastroenterology | Admitting: Gastroenterology

## 2019-12-21 ENCOUNTER — Encounter
Admission: RE | Disposition: A | Discharge status: Home / Self Care | Payer: Self-pay | Source: Home / Self Care | Attending: Gastroenterology

## 2019-12-21 DIAGNOSIS — K219 Gastro-esophageal reflux disease without esophagitis: Secondary | ICD-10-CM

## 2019-12-21 DIAGNOSIS — K635 Polyp of colon: Secondary | ICD-10-CM | POA: Insufficient documentation

## 2019-12-21 DIAGNOSIS — K644 Residual hemorrhoidal skin tags: Secondary | ICD-10-CM | POA: Insufficient documentation

## 2019-12-21 DIAGNOSIS — Z888 Allergy status to other drugs, medicaments and biological substances status: Secondary | ICD-10-CM | POA: Diagnosis not present

## 2019-12-21 DIAGNOSIS — Z79890 Hormone replacement therapy (postmenopausal): Secondary | ICD-10-CM | POA: Insufficient documentation

## 2019-12-21 DIAGNOSIS — Z91040 Latex allergy status: Secondary | ICD-10-CM | POA: Insufficient documentation

## 2019-12-21 DIAGNOSIS — R1013 Epigastric pain: Secondary | ICD-10-CM | POA: Diagnosis not present

## 2019-12-21 DIAGNOSIS — Z79899 Other long term (current) drug therapy: Secondary | ICD-10-CM | POA: Diagnosis not present

## 2019-12-21 DIAGNOSIS — Z885 Allergy status to narcotic agent status: Secondary | ICD-10-CM | POA: Insufficient documentation

## 2019-12-21 DIAGNOSIS — E669 Obesity, unspecified: Secondary | ICD-10-CM

## 2019-12-21 DIAGNOSIS — I10 Essential (primary) hypertension: Secondary | ICD-10-CM | POA: Diagnosis not present

## 2019-12-21 DIAGNOSIS — J449 Chronic obstructive pulmonary disease, unspecified: Secondary | ICD-10-CM | POA: Diagnosis not present

## 2019-12-21 DIAGNOSIS — Z7951 Long term (current) use of inhaled steroids: Secondary | ICD-10-CM | POA: Insufficient documentation

## 2019-12-21 DIAGNOSIS — Z887 Allergy status to serum and vaccine status: Secondary | ICD-10-CM | POA: Diagnosis not present

## 2019-12-21 DIAGNOSIS — Z1211 Encounter for screening for malignant neoplasm of colon: Secondary | ICD-10-CM | POA: Diagnosis not present

## 2019-12-21 DIAGNOSIS — F17210 Nicotine dependence, cigarettes, uncomplicated: Secondary | ICD-10-CM | POA: Insufficient documentation

## 2019-12-21 DIAGNOSIS — K5730 Diverticulosis of large intestine without perforation or abscess without bleeding: Secondary | ICD-10-CM | POA: Diagnosis not present

## 2019-12-21 DIAGNOSIS — K3189 Other diseases of stomach and duodenum: Secondary | ICD-10-CM | POA: Insufficient documentation

## 2019-12-21 DIAGNOSIS — E1069 Type 1 diabetes mellitus with other specified complication: Secondary | ICD-10-CM

## 2019-12-21 DIAGNOSIS — E039 Hypothyroidism, unspecified: Secondary | ICD-10-CM | POA: Diagnosis not present

## 2019-12-21 DIAGNOSIS — Z85038 Personal history of other malignant neoplasm of large intestine: Secondary | ICD-10-CM | POA: Insufficient documentation

## 2019-12-21 DIAGNOSIS — E119 Type 2 diabetes mellitus without complications: Secondary | ICD-10-CM | POA: Diagnosis not present

## 2019-12-21 DIAGNOSIS — Z980 Intestinal bypass and anastomosis status: Secondary | ICD-10-CM | POA: Diagnosis not present

## 2019-12-21 DIAGNOSIS — Z794 Long term (current) use of insulin: Secondary | ICD-10-CM | POA: Insufficient documentation

## 2019-12-21 DIAGNOSIS — K9189 Other postprocedural complications and disorders of digestive system: Secondary | ICD-10-CM | POA: Diagnosis not present

## 2019-12-21 DIAGNOSIS — D127 Benign neoplasm of rectosigmoid junction: Secondary | ICD-10-CM | POA: Diagnosis not present

## 2019-12-21 HISTORY — PX: COLONOSCOPY WITH PROPOFOL: SHX5780

## 2019-12-21 HISTORY — DX: Von willebrand disease, type 1: D68.01

## 2019-12-21 HISTORY — DX: Von Willebrand's disease: D68.0

## 2019-12-21 HISTORY — PX: ESOPHAGOGASTRODUODENOSCOPY (EGD) WITH PROPOFOL: SHX5813

## 2019-12-21 LAB — GLUCOSE, CAPILLARY
Glucose-Capillary: 335 mg/dL — ABNORMAL HIGH (ref 70–99)
Glucose-Capillary: 242 mg/dL — ABNORMAL HIGH (ref 70–99)

## 2019-12-21 SURGERY — COLONOSCOPY WITH PROPOFOL
Anesthesia: General

## 2019-12-21 MED ORDER — PROPOFOL 10 MG/ML IV BOLUS
INTRAVENOUS | Status: DC | PRN
  Administered 2019-12-21: 50 mg via INTRAVENOUS
  Administered 2019-12-21: 10 mg via INTRAVENOUS

## 2019-12-21 MED ORDER — SODIUM CHLORIDE 0.9 % IV SOLN: INTRAVENOUS | Status: DC

## 2019-12-21 MED ORDER — LIDOCAINE HCL (CARDIAC) PF 100 MG/5ML IV SOSY
PREFILLED_SYRINGE | INTRAVENOUS | Status: DC | PRN
  Administered 2019-12-21: 100 mg via INTRAVENOUS

## 2019-12-21 MED ORDER — INSULIN ASPART 100 UNIT/ML ~~LOC~~ SOLN
SUBCUTANEOUS | Status: AC
  Administered 2019-12-21: 7 [IU] via SUBCUTANEOUS
  Filled 2019-12-21: qty 1

## 2019-12-21 MED ORDER — PROPOFOL 500 MG/50ML IV EMUL
INTRAVENOUS | Status: DC | PRN
  Administered 2019-12-21: 135 ug/kg/min via INTRAVENOUS

## 2019-12-21 MED ORDER — GLYCOPYRROLATE 0.2 MG/ML IJ SOLN
INTRAMUSCULAR | Status: DC | PRN
  Administered 2019-12-21: .2 mg via INTRAVENOUS

## 2019-12-21 MED ORDER — INSULIN ASPART 100 UNIT/ML ~~LOC~~ SOLN: 7.0000 [IU] | Freq: Once | SUBCUTANEOUS | Status: AC

## 2019-12-21 NOTE — Anesthesia Postprocedure Evaluation (Signed)
Anesthesia Post Note  Patient: Margaret Church  Procedure(s) Performed: COLONOSCOPY WITH PROPOFOL (N/A ) ESOPHAGOGASTRODUODENOSCOPY (EGD) WITH PROPOFOL (N/A )  Patient location during evaluation: Endoscopy Anesthesia Type: General Level of consciousness: awake and alert and oriented Pain management: pain level controlled Vital Signs Assessment: post-procedure vital signs reviewed and stable Respiratory status: spontaneous breathing Cardiovascular status: blood pressure returned to baseline Anesthetic complications: no   No complications documented.   Last Vitals:  Vitals:   12/21/19 0905 12/21/19 0915  BP: 114/77 110/79  Pulse: 70 68  Resp: 14 12  Temp:    SpO2: 95% 95%    Last Pain:  Vitals:   12/21/19 0905  TempSrc:   PainSc: 0-No pain                 Preeya Cleckley

## 2019-12-21 NOTE — Anesthesia Procedure Notes (Signed)
Procedure Name: General with mask airway Performed by: Fletcher-Harrison, Brainard Highfill, CRNA Pre-anesthesia Checklist: Patient identified, Emergency Drugs available, Suction available and Patient being monitored Patient Re-evaluated:Patient Re-evaluated prior to induction Oxygen Delivery Method: Simple face mask Induction Type: IV induction Placement Confirmation: positive ETCO2 and CO2 detector Dental Injury: Teeth and Oropharynx as per pre-operative assessment        

## 2019-12-21 NOTE — H&P (Signed)
Margaret Darby, MD [address]  [place]  [place], [place] [address]  Main: [phone]  Fax: [phone] Pager: [phone]  Primary Care Physician:  Venita Lick, NP Primary Gastroenterologist:  Dr. Cephas Church  Pre-Procedure History & Physical: HPI:  Margaret Church is a 54 y.o. female is here for an endoscopy and colonoscopy.   Past Medical History:  Diagnosis Date  . Anxiety   . Arthritis   . Asthma   . Cancer ([place])   . Clotting disorder ([place])   . COPD (chronic obstructive pulmonary disease) ([place])   . Depression   . Diabetes mellitus without complication ([place])   . Family history of leukemia   . Family history of lymphoma   . Family history of skin cancer   . Family history of thyroid cancer   . GERD (gastroesophageal reflux disease)   . History of esophageal cancer   . Hyperlipidemia   . Hypertension   . Neuromuscular disorder ([place])   . Oxygen deficiency   . Seizures ([facility] [address])   . Sleep apnea   . Thyroid disease   . Von Willebrand disease, type I [facility])     Past Surgical History:  Procedure Laterality Date  . ABDOMINAL HYSTERECTOMY    . APPENDECTOMY    . COLON SURGERY    . HERNIA REPAIR    . KNEE SURGERY    . tonsils surgery      Prior to Admission medications   Medication Sig Start Date End Date Taking? Authorizing Provider  albuterol (VENTOLIN HFA) 108 (90 Base) MCG/ACT inhaler Inhale 2 puffs into the lungs every 6 (six) hours as needed for wheezing or shortness of breath. 09/01/19  Yes Cannady, Jolene T, NP  amLODipine (NORVASC) 5 MG tablet Take 1 tablet (5 mg total) by mouth daily. 09/01/19  Yes Cannady, Jolene T, NP  amphetamine-dextroamphetamine (ADDERALL) 12.5 MG tablet Take 12.5 mg by mouth 2 (two) times daily.   Yes [provider]  atenolol (TENORMIN) 50 MG tablet Take 1 tablet (50 mg total) by mouth daily. 09/01/19  Yes Cannady, Jolene T, NP  baclofen (LIORESAL) 20 MG tablet Take 1 tablet (20 mg total) by mouth 2 (two)  times daily. 11/01/19  Yes Cannady, Jolene T, NP  insulin lispro (HUMALOG KWIKPEN) 100 UNIT/ML KwikPen Inject 4 to 18 units subcutaneously up to 3-4 times a day. 09/01/19  Yes Cannady, Jolene T, NP  levothyroxine (SYNTHROID) 200 MCG tablet Take 1 tablet (200 mcg total) by mouth daily. With 25 mcg. (total 225 mcg daily) 09/01/19  Yes Cannady, Jolene T, NP  ACCU-CHEK GUIDE test strip USE TO TEST BLOOD SUGAR FOUR TIMES DAILY 11/26/19   Cannady, Jolene T, NP  Accu-Chek Softclix Lancets lancets USE TO TEST BLOOD SUGAR FOUR TIMES DAILY 10/31/19   Cannady, Jolene T, NP  albuterol (PROVENTIL) (2.5 MG/3ML) 0.083% nebulizer solution Take 3 mLs (2.5 mg total) by nebulization every 6 (six) hours as needed for wheezing or shortness of breath. 12/20/19   Tyler Pita, MD  atorvastatin (LIPITOR) 40 MG tablet Take 1 tablet (40 mg total) by mouth at bedtime. 09/06/19   Cannady, Henrine Screws T, NP  Blood Glucose Monitoring Suppl (ACCU-CHEK GUIDE ME) w/Device KIT USE TO TEST BLOOD SUGAR FOUR TIMES DAILY 10/31/19   Cannady, Henrine Screws T, NP  Cholecalciferol (VITAMIN D3) 1.25 MG (50000 UT) CAPS TAKE 1 CAPSULE BY MOUTH ONCE A WEEK FOR  8  WEEKS AND THEN  STOP  RETURN  TO  OFFICE  FOR  LAB  DRAW 10/31/19   Cannady, Henrine Screws T, NP  diazepam (VALIUM) 5 MG tablet Take 5 mg by mouth every 6 (six) hours as needed for anxiety.    [provider]  DULoxetine (CYMBALTA) 30 MG capsule Take 1 capsule (30 mg total) by mouth 2 (two) times daily. 09/01/19   Cannady, Henrine Screws T, NP  Fluticasone-Salmeterol (ADVAIR) 250-50 MCG/DOSE AEPB Inhale 1 puff into the lungs daily. 12/11/19   Marnee Guarneri T, NP  gabapentin (NEURONTIN) 100 MG capsule  11/26/19   [provider]  hydrOXYzine (ATARAX/VISTARIL) 25 MG tablet Take 1 tablet (25 mg total) by mouth 3 (three) times daily. 09/01/19   Cannady, Henrine Screws T, NP  insulin glargine (LANTUS) 100 UNIT/ML Solostar Pen Inject 42 Units into the skin at bedtime. 09/01/19   Cannady, Henrine Screws T, NP    levothyroxine (SYNTHROID) 25 MCG tablet Take 25 mcg by mouth daily before breakfast. Take in addition to the 200 mg    [provider]  polyethylene glycol powder (GLYCOLAX/MIRALAX) 17 GM/SCOOP powder Take 255 g by mouth daily. 12/04/19   Lin Landsman, MD  Tiotropium Bromide Monohydrate (SPIRIVA RESPIMAT) 2.5 MCG/ACT AERS Inhale 2 puffs into the lungs daily. 12/20/19   Tyler Pita, MD  Tiotropium Bromide Monohydrate (SPIRIVA RESPIMAT) 2.5 MCG/ACT AERS Inhale 2 puffs into the lungs daily. 01/16/20   Tyler Pita, MD  tiZANidine (ZANAFLEX) 4 MG tablet Take 1 tablet (4 mg total) by mouth 3 (three) times daily as needed. 09/01/19   Marnee Guarneri T, NP    Allergies as of 12/04/2019 - Review Complete 12/04/2019  Allergen Reaction Noted  . Latex  07/21/2017  . Tetanus antitoxin  09/21/2017  . Gabapentin Rash 07/21/2017  . Other Other (See Comments), Rash, and Swelling 03/28/2018  . Tramadol Rash 07/21/2017    Family History  Problem Relation Age of Onset  . Lung cancer Mother 40  . Cancer Mother        skin cancer;   . Heart disease Mother   . Alzheimer's disease Father   . Cancer Father        skin cancer  . Hypertension Father   . Hip fracture Brother   . Hypertension Brother   . Skin cancer Brother   . Lymphoma Paternal Grandmother   . Alcohol abuse Brother   . Hypertension Brother   . Other Brother        large tumor on thigh removed in [place]  . Multiple myeloma Paternal Uncle   . Von Willebrand disease Paternal Aunt        and carcinoid disease  . Skin cancer Maternal Aunt   . Thyroid cancer Other   . Skin cancer Maternal Aunt   . Cancer Paternal Uncle        unk type  . Liver cancer Paternal Uncle   . Esophageal cancer Paternal Aunt   . Colon polyps Daughter 50    Social History   Socioeconomic History  . Marital status: Significant Other    Spouse name: Not on file  . Number of children: Not on file  . Years of education: Not on file   . Highest education level: Not on file  Occupational History  . Not on file  Tobacco Use  . Smoking status: Current Every Day Smoker    Packs/day: 0.50    Years: 8.00    Pack years: 4.00    Types: Cigarettes  . Smokeless tobacco: Never Used  . Tobacco comment: 4  Cigs daily--12/20/2019  Vaping Use  . Vaping Use: Never used  Substance and Sexual Activity  . Alcohol use: Never  . Drug use: Not Currently    Types: Marijuana    Comment: medical marijuana card  . Sexual activity: Yes  Other Topics Concern  . 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 [place] closer to [place] ago [place] Social smoker 4-5 cig/ day; no alcohol; hx of marijuana/ palliative care [[place]]   Social Determinants of Health   Financial Resource Strain:   . Difficulty of Paying Living Expenses: Not on file  Food Insecurity:   . Worried About Charity fundraiser in the Last Year: Not on file  . Ran Out of Food in the Last Year: Not on file  Transportation Needs:   . Lack of Transportation (Medical): Not on file  . Lack of Transportation (Non-Medical): Not on file  Physical Activity:   . Days of Exercise per Week: Not on file  . Minutes of Exercise per Session: Not on file  Stress:   . Feeling of Stress : Not on file  Social Connections:   . Frequency of Communication with Friends and Family: Not on file  . Frequency of Social Gatherings with Friends and Family: Not on file  . Attends Religious Services: Not on file  . Active Member of Clubs or Organizations: Not on file  . Attends Archivist Meetings: Not on file  . Marital Status: Not on file  Intimate Partner Violence:   . Fear of Current or Ex-Partner: Not on file  . Emotionally Abused: Not on file  . Physically Abused: Not on file  . Sexually Abused: Not on file    Review of Systems: See HPI, otherwise negative  ROS  Physical Exam: BP 112/82   Pulse 93   Temp 97.9 F (36.6 C) (Temporal)   Resp 18   Ht '5\' 5"'  (1.651 m)   Wt 94.3 kg   SpO2 95%   BMI 34.61 kg/m  General:   Alert,  pleasant and cooperative in NAD Head:  Normocephalic and atraumatic. Neck:  Supple; no masses or thyromegaly. Lungs:  Clear throughout to auscultation.    Heart:  Regular rate and rhythm. Abdomen:  Soft, nontender and nondistended. Normal bowel sounds, without guarding, and without rebound.   Neurologic:  Alert and  oriented x4;  grossly normal neurologically.  Impression/Plan: Robynne Roat is here for an endoscopy and colonoscopy to be performed for dyspepsia, h/o colon cancer  Risks, benefits, limitations, and alternatives regarding  endoscopy and colonoscopy have been reviewed with the patient.  Questions have been answered.  All parties agreeable.   Sherri Sear, MD  12/21/2019, 8:15 AM

## 2019-12-21 NOTE — Op Note (Signed)
[facility] Gastroenterology Patient Name: Margaret Church Procedure Date: 12/21/2019 8:09 AM MRN: [id number] Account #: [id number] Date of Birth: Jan 29, 1966 Admit Type: Outpatient Age: 54 Room: [facility] ENDO ROOM 4 Gender: Female Note Status: Finalized Procedure:             Colonoscopy Indications:           High risk colon cancer surveillance: Personal history                         of colon cancer Providers:             Lin Landsman MD, MD Referring MD:          Marnee Guarneri Medicines:             General Anesthesia Complications:         No immediate complications. Estimated blood loss: None. Procedure:             Pre-Anesthesia Assessment:                        - Prior to the procedure, a History and Physical was                         performed, and patient medications and allergies were                         reviewed. The patient is competent. The risks and                         benefits of the procedure and the sedation options and                         risks were discussed with the patient. All questions                         were answered and informed consent was obtained.                         Patient identification and proposed procedure were                         verified by the physician, the nurse, the                         anesthesiologist, the anesthetist and the technician                         in the pre-procedure area in the procedure room in the                         endoscopy suite. Mental Status Examination: alert and                         oriented. Airway Examination: normal oropharyngeal                         airway and neck mobility. Respiratory Examination:  clear to auscultation. CV Examination: normal.                         Prophylactic Antibiotics: The patient does not require                         prophylactic antibiotics. Prior Anticoagulants: The                          patient has taken no previous anticoagulant or                         antiplatelet agents. ASA Grade Assessment: IV - A                         patient with severe systemic disease that is a                         constant threat to life. After reviewing the risks and                         benefits, the patient was deemed in satisfactory                         condition to undergo the procedure. The anesthesia                         plan was to use general anesthesia. Immediately prior                         to administration of medications, the patient was                         re-assessed for adequacy to receive sedatives. The                         heart rate, respiratory rate, oxygen saturations,                         blood pressure, adequacy of pulmonary ventilation, and                         response to care were monitored throughout the                         procedure. The physical status of the patient was                         re-assessed after the procedure.                        After obtaining informed consent, the colonoscope was                         passed under direct vision. Throughout the procedure,                         the patient's blood pressure, pulse, and oxygen  saturations were monitored continuously. The                         Colonoscope was introduced through the anus and                         advanced to the the cecum, identified by appendiceal                         orifice and ileocecal valve. The colonoscopy was                         performed without difficulty. The patient tolerated                         the procedure well. The quality of the bowel                         preparation was evaluated using the BBPS [facility] Bowel                         Preparation Scale) with scores of: Right Colon = 3,                         Transverse Colon = 3 and Left Colon = 3 (entire mucosa                          seen well with no residual staining, small fragments                         of stool or opaque liquid). The total BBPS score                         equals 9. Findings:      Hemorrhoids were found on perianal exam.      A 3 mm polyp was found in the recto-sigmoid colon. The polyp was       sessile. The polyp was removed with a cold snare. Resection and       retrieval were complete.      There was evidence of a prior functional end-to-end colo-colonic       anastomosis at 20 cm proximal to the anus. This was patent and was       characterized by healthy appearing mucosa. The anastomosis was traversed.      A few diverticula were found in the sigmoid colon and descending colon.      Non-bleeding external hemorrhoids were found during retroflexion. The       hemorrhoids were medium-sized.      The exam was otherwise without abnormality. Impression:            - Hemorrhoids found on perianal exam.                        - One 3 mm polyp at the recto-sigmoid colon, removed                         with a cold snare. Resected and retrieved.                        -  Patent functional end-to-end colo-colonic                         anastomosis, characterized by healthy appearing mucosa.                        - Diverticulosis in the sigmoid colon and in the                         descending colon.                        - Non-bleeding external hemorrhoids.                        - The examination was otherwise normal. Recommendation:        - Discharge patient to home (with escort).                        - Resume previous diet today.                        - Continue present medications.                        - Await pathology results.                        - Repeat colonoscopy in 5 years for surveillance. Procedure Code(s):     --- Professional ---                        [phone], Colonoscopy, flexible; with removal of                         tumor(s), polyp(s), or other lesion(s) by snare                          technique Diagnosis Code(s):     --- Professional ---                        V40.086, Personal history of other malignant neoplasm                         of large intestine                        K64.4, Residual hemorrhoidal skin tags                        K63.5, Polyp of colon                        Z98.0, Intestinal bypass and anastomosis status                        K57.30, Diverticulosis of large intestine without                         perforation or abscess without bleeding CPT copyright 2019 American Medical Association. All rights reserved. The codes documented in this report are preliminary and upon coder review  may  be revised to meet current compliance requirements. Dr. Ulyess Mort Lin Landsman MD, MD 12/21/2019 8:53:30 AM This report has been signed electronically. Number of Addenda: 0 Note Initiated On: 12/21/2019 8:09 AM Scope Withdrawal Time: 0 hours 11 minutes 48 seconds  Total Procedure Duration: 0 hours 13 minutes 5 seconds  Estimated Blood Loss:  Estimated blood loss: none.      [facility]

## 2019-12-21 NOTE — Anesthesia Preprocedure Evaluation (Signed)
Anesthesia Evaluation  Patient identified by MRN, date of birth, ID band Patient awake    Airway Mallampati: III       Dental   Pulmonary asthma , sleep apnea , COPD, Current Smoker,           Cardiovascular hypertension, Pt. on medications      Neuro/Psych Seizures -,  PSYCHIATRIC DISORDERS Anxiety Depression  Neuromuscular disease    GI/Hepatic GERD  ,  Endo/Other  diabetesHypothyroidism   Renal/GU      Musculoskeletal  (+) Arthritis , Osteoarthritis,    Abdominal   Peds  Hematology   Anesthesia Other Findings Past Medical History: No date: Anxiety No date: Arthritis No date: Asthma No date: Cancer ([place]) No date: Clotting disorder ([place]) No date: COPD (chronic obstructive pulmonary disease) (HCC) No date: Depression No date: Diabetes mellitus without complication (HCC) No date: Family history of leukemia No date: Family history of lymphoma No date: Family history of skin cancer No date: Family history of thyroid cancer No date: GERD (gastroesophageal reflux disease) No date: History of esophageal cancer No date: Hyperlipidemia No date: Hypertension No date: Neuromuscular disorder ([place]) No date: Oxygen deficiency No date: Seizures ([place]) No date: Sleep apnea No date: Thyroid disease No date: Von Willebrand disease, type I ([place]) MS  Reproductive/Obstetrics                             Anesthesia Physical Anesthesia Plan  ASA: IV  Anesthesia Plan: General   Post-op Pain Management:    Induction: Intravenous  PONV Risk Score and Plan: Propofol infusion  Airway Management Planned:   Additional Equipment:   Intra-op Plan:   Post-operative Plan:   Informed Consent: I have reviewed the patients History and Physical, chart, labs and discussed the procedure including the risks, benefits and alternatives for the proposed anesthesia with the patient or authorized representative  who has indicated his/her understanding and acceptance.     Dental advisory given  Plan Discussed with: CRNA and Surgeon  Anesthesia Plan Comments:         Anesthesia Quick Evaluation

## 2019-12-21 NOTE — Op Note (Signed)
[facility] Gastroenterology Patient Name: Margaret Church Procedure Date: 12/21/2019 8:11 AM MRN: [id number] Account #: [id number] Date of Birth: 1965-07-12 Admit Type: Outpatient Age: 54 Room: [facility] ENDO ROOM 4 Gender: Female Note Status: Finalized Procedure:             Upper GI endoscopy Indications:           Dyspepsia Providers:             Lin Landsman MD, MD Referring MD:          Marnee Guarneri Medicines:             General Anesthesia Complications:         No immediate complications. Estimated blood loss: None. Procedure:             Pre-Anesthesia Assessment:                        - Prior to the procedure, a History and Physical was                         performed, and patient medications and allergies were                         reviewed. The patient is competent. The risks and                         benefits of the procedure and the sedation options and                         risks were discussed with the patient. All questions                         were answered and informed consent was obtained.                         Patient identification and proposed procedure were                         verified by the physician, the nurse, the                         anesthesiologist, the anesthetist and the technician                         in the pre-procedure area in the procedure room in the                         endoscopy suite. Mental Status Examination: alert and                         oriented. Airway Examination: normal oropharyngeal                         airway and neck mobility. Respiratory Examination:                         clear to auscultation. CV Examination: normal.  Prophylactic Antibiotics: The patient does not require                         prophylactic antibiotics. Prior Anticoagulants: The                         patient has taken no previous anticoagulant or                          antiplatelet agents. ASA Grade Assessment: IV - A                         patient with severe systemic disease that is a                         constant threat to life. After reviewing the risks and                         benefits, the patient was deemed in satisfactory                         condition to undergo the procedure. The anesthesia                         plan was to use general anesthesia. Immediately prior                         to administration of medications, the patient was                         re-assessed for adequacy to receive sedatives. The                         heart rate, respiratory rate, oxygen saturations,                         blood pressure, adequacy of pulmonary ventilation, and                         response to care were monitored throughout the                         procedure. The physical status of the patient was                         re-assessed after the procedure.                        After obtaining informed consent, the endoscope was                         passed under direct vision. Throughout the procedure,                         the patient's blood pressure, pulse, and oxygen                         saturations were monitored continuously. The Endoscope  was introduced through the mouth, and advanced to the                         second part of duodenum. The upper GI endoscopy was                         accomplished without difficulty. The patient tolerated                         the procedure well. Findings:      The examined duodenum was normal. Biopsies for histology were taken with       a cold forceps for evaluation of celiac disease.      Striped moderately erythematous mucosa without bleeding was found in the       gastric antrum. Biopsies were taken with a cold forceps for Helicobacter       pylori testing.      The gastric body and incisura were normal. Biopsies were taken with a       cold  forceps for Helicobacter pylori testing.      The cardia and gastric fundus were normal on retroflexion.      Esophagogastric landmarks were identified: the gastroesophageal junction       was found at 38 cm from the incisors.      The gastroesophageal junction and examined esophagus were normal.       Biopsies were taken with a cold forceps for histology. Impression:            - Normal examined duodenum. Biopsied.                        - Erythematous mucosa in the antrum. Biopsied.                        - Normal gastric body and incisura. Biopsied.                        - Esophagogastric landmarks identified.                        - Normal gastroesophageal junction and esophagus.                         Biopsied. Recommendation:        - Await pathology results.                        - Continue present medications.                        - Proceed with colonoscopy as scheduled                        See colonoscopy report Procedure Code(s):     --- Professional ---                        [phone], Esophagogastroduodenoscopy, flexible,                         transoral; with biopsy, single or multiple Diagnosis Code(s):     --- Professional ---  K31.89, Other diseases of stomach and duodenum                        R10.13, Epigastric pain CPT copyright 2019 American Medical Association. All rights reserved. The codes documented in this report are preliminary and upon coder review may  be revised to meet current compliance requirements. Dr. Ulyess Mort Lin Landsman MD, MD 12/21/2019 8:35:31 AM This report has been signed electronically. Number of Addenda: 0 Note Initiated On: 12/21/2019 8:11 AM Estimated Blood Loss:  Estimated blood loss: none.      [facility]

## 2019-12-21 NOTE — Transfer of Care (Signed)
Immediate Anesthesia Transfer of Care Note  Patient: Margaret Church  Procedure(s) Performed: COLONOSCOPY WITH PROPOFOL (N/A ) ESOPHAGOGASTRODUODENOSCOPY (EGD) WITH PROPOFOL (N/A )  Patient Location: Endoscopy Unit  Anesthesia Type:General  Level of Consciousness: awake, drowsy and patient cooperative  Airway & Oxygen Therapy: Patient Spontanous Breathing and Patient connected to face mask oxygen  Post-op Assessment: Post -op Vital signs reviewed and stable  Post vital signs: Reviewed and stable  Last Vitals:  Vitals Value Taken Time  BP 108/73 12/21/19 0855  Temp    Pulse 75 12/21/19 0855  Resp 19 12/21/19 0855  SpO2 99 % 12/21/19 0855  Vitals shown include unvalidated device data.  Last Pain:  Vitals:   12/21/19 0736  TempSrc: Temporal  PainSc: 6          Complications: No complications documented.

## 2019-12-22 ENCOUNTER — Encounter: Payer: Self-pay | Admitting: Gastroenterology

## 2019-12-22 LAB — SURGICAL PATHOLOGY

## 2019-12-25 ENCOUNTER — Encounter: Payer: Self-pay | Admitting: Gastroenterology

## 2019-12-29 ENCOUNTER — Telehealth: Payer: Self-pay | Admitting: Nurse Practitioner

## 2019-12-29 NOTE — Telephone Encounter (Signed)
Requested medication (s) are due for refill today: no  Requested medication (s) are on the active medication list: no  Last refill:  09/01/2019  Future visit scheduled: No  Notes to clinic:  Medication not assigned to a protocol, review manually   Requested Prescriptions  Pending Prescriptions Disp Refills   amoxicillin-clavulanate (AUGMENTIN) 875-125 MG tablet [Pharmacy Med Name: Amoxicillin-Pot Clavulanate 875-125 MG Oral Tablet] 14 tablet 0    Sig: Take 1 tablet by mouth twice daily for 7 days      Off-Protocol Failed - 12/29/2019  7:26 PM      Failed - Medication not assigned to a protocol, review manually.      Passed - Valid encounter within last 12 months    Recent Outpatient Visits           3 months ago Depression, recurrent ([place])   [place], Barbaraann Faster, NP   3 months ago Encounter to establish care   [facility] Venita Lick, NP       Future Appointments             In 2 weeks Vanga, Tally Due, MD [place]

## 2020-01-01 NOTE — Telephone Encounter (Signed)
Called pt to schedule appt, no answer, left vm 

## 2020-01-02 ENCOUNTER — Ambulatory Visit: Payer: Medicaid Other

## 2020-01-02 NOTE — Telephone Encounter (Signed)
Pt called back and said did not need this med, offered appt. She is going to call back later before any refills .

## 2020-01-05 ENCOUNTER — Telehealth: Payer: Self-pay

## 2020-01-05 NOTE — Chronic Care Management (AMB) (Signed)
°  Care Management   Note  01/05/2020 Name: [name] MRN: [id number] DOB: 1965/04/16  Margaret Church is a 54 y.o. year old female who is a primary care patient of Venita Lick, NP and is actively engaged with the care management team. I reached out to [name] by phone today to assist with re-scheduling an initial visit with the RN Case Manager Licensed Clinical Social Worker  Follow up plan: Unsuccessful telephone outreach attempt made. A HIPAA compliant phone message was left for the patient providing contact information and requesting a return call.  The care management team will reach out to the patient again over the next 7 days.  If patient returns call to provider office, please advise to call [facility]  at [place], [place], [place], [address] [address] Direct Dial: [phone] [email or web address] Website: [email or web address]

## 2020-01-08 ENCOUNTER — Other Ambulatory Visit: Payer: Self-pay

## 2020-01-08 NOTE — Telephone Encounter (Signed)
Patient was last seen on September 01, 2019. Was due to come back in 8 weeks for vitamin d re check, patient has no future visit scheduled. Routing to admin for appointment. Routing to provider for possible refill.

## 2020-01-08 NOTE — Telephone Encounter (Signed)
Unable to lvm to make apt. Number has been disconnected.

## 2020-01-09 ENCOUNTER — Encounter: Payer: Self-pay | Admitting: Nurse Practitioner

## 2020-01-09 NOTE — Telephone Encounter (Signed)
Called number not working, Engineer, building services and mailed letter to schedule

## 2020-01-15 ENCOUNTER — Ambulatory Visit (INDEPENDENT_AMBULATORY_CARE_PROVIDER_SITE_OTHER): Payer: Medicaid Other | Admitting: Gastroenterology

## 2020-01-15 ENCOUNTER — Other Ambulatory Visit: Payer: Self-pay

## 2020-01-15 ENCOUNTER — Ambulatory Visit: Payer: Medicaid Other | Attending: Pulmonary Disease

## 2020-01-15 ENCOUNTER — Encounter: Payer: Self-pay | Admitting: Gastroenterology

## 2020-01-15 VITALS — BP 154/96 | HR 86 | Temp 97.6°F

## 2020-01-15 DIAGNOSIS — K5909 Other constipation: Secondary | ICD-10-CM

## 2020-01-15 DIAGNOSIS — R140 Abdominal distension (gaseous): Secondary | ICD-10-CM | POA: Diagnosis not present

## 2020-01-15 MED ORDER — RIFAXIMIN 550 MG PO TABS: 550.0000 mg | ORAL_TABLET | Freq: Three times a day (TID) | ORAL | 0 refills | Status: AC

## 2020-01-15 NOTE — Telephone Encounter (Signed)
Sent letter

## 2020-01-15 NOTE — Telephone Encounter (Signed)
Unable to lvm to make apt. Number has been disconnected.

## 2020-01-15 NOTE — Chronic Care Management (AMB) (Signed)
  Care Management   Note  01/15/2020 Name: [name] MRN: [id number] DOB: 10/21/1965  Margaret Church is a 54 y.o. year old female who is a primary care patient of Venita Lick, NP and is actively engaged with the care management team. I reached out to [name] by phone today to assist with re-scheduling an initial visit with the RN Case Manager Licensed Clinical Social Worker  Follow up plan: Pt at another Doctors appt and will have to call back to schedule. The patient has been provided with contact information for the care management team and has been advised to call with any health related questions or concerns.   Noreene Larsson, [place], [place], [place] [address] Direct Dial: [phone] [email or web address] Website: [email or web address]

## 2020-01-15 NOTE — Progress Notes (Signed)
Margaret Darby, MD [address]  [place]  [place], [place] [address]  Main: [phone]  Fax: [phone]    Gastroenterology Consultation  Referring Provider:     Venita Lick, NP Primary Care Physician:  Venita Lick, NP Primary Gastroenterologist:  Dr. Cephas Church Reason for Consultation:     Upper abdominal pain, nausea, GERD, chronic constipation, history of colon cancer, stone in common bile duct        HPI:   Margaret Church is a 54 y.o. female referred by Dr. Venita Lick, NP  for consultation & management of constellation of several GI symptoms.  She does have significant past medical history including multiple sclerosis, history of appendicular carcinoid s/p appendectomy, history of colon cancer, s/p colectomy and neoadjuvant chemo, history of thyroid cancer s/p thyroidectomy, insulin-dependent diabetes, steroid-induced cushingoid syndrome, von Willebrand's disease  Upper abdominal pain, nausea: Patient reports chronic history of upper abdominal pain associated with nausea, abdominal bloating.  Upper abdominal pain is mostly concentrated in the right upper quadrant, worse after eating, radiates across abdomen.  She also reports history of heartburn with sensation of food stuck.  She was previously on a PPI.  Currently on none.  She reports having had an upper endoscopy several years ago.  Chronic constipation: She reports having hard bowel movements, associated with significant straining.  She denies rectal bleeding.  She reports trying over-the-counter magnesium citrate, MiraLAX with not much relief.  She is concerned about severe abdominal bloating as well  Colon cancer diagnosed in 2009 s/p colectomy and neoadjuvant chemo.  She reports that she is past due for surveillance colonoscopy.  Bile duct stone: Patient reports that she underwent CT when she was in [facility] few months ago.  This was performed for elevated alkaline phosphatase as  well as upper abdominal pain.  Due to Covid, her procedure was delayed and she did not follow-up with GI until now  Follow-up visit 01/16/2020 Patient continues to remain concerned regarding episodes of severe abdominal bloating.  Patient underwent upper endoscopy as well as colonoscopy including biopsies which were unremarkable.  Patient could not pinpoint to any particular food triggers.  Patient is accompanied by her husband today.  Patient does not have von Willebrand's disease per Dr. Rogue Bussing after extensive review of her past medical records.  NSAIDs: None  Antiplts/Anticoagulants/Anti thrombotics: None  GI Procedures: Colonoscopy more than 5 years ago, reportedly no recurrence of cancer Upper endoscopy several years ago, reportedly normal EGD and colonoscopy 12/21/2019 - Normal examined duodenum. Biopsied. - Erythematous mucosa in the antrum. Biopsied. - Normal gastric body and incisura. Biopsied. - Esophagogastric landmarks identified. - Normal gastroesophageal junction and esophagus. Biopsied.  - Hemorrhoids found on perianal exam. - One 3 mm polyp at the recto-sigmoid colon, removed with a cold snare. Resected and retrieved. - Patent functional end-to-end colo-colonic anastomosis, characterized by healthy appearing mucosa. - Diverticulosis in the sigmoid colon and in the descending colon. - Non-bleeding external hemorrhoids.  DIAGNOSIS:  A. DUODENUM; COLD BIOPSY:  - ENTERIC MUCOSA WITH PRESERVED VILLOUS ARCHITECTURE AND NO SIGNIFICANT  HISTOPATHOLOGIC CHANGE.  - NEGATIVE FOR FEATURES OF CELIAC, DYSPLASIA, AND MALIGNANCY.   B. STOMACH; COLD BIOPSY:  - GASTRIC ANTRAL AND OXYNTIC MUCOSA WITH FEATURES OF MILD REACTIVE  GASTROPATHY.  - NEGATIVE FOR H. PYLORI, DYSPLASIA, AND MALIGNANCY.   Comment:  The differential diagnosis for these findings includes drug/chemical  injury (NSAID vs. other), bile reflux, and changes adjacent to an area  of healing  ulceration. Clinical  correlation with endoscopic findings is  required.   C. ESOPHAGUS; COLD BIOPSY:  - BENIGN SQUAMOUS MUCOSA WITH NO SIGNIFICANT HISTOPATHOLOGIC CHANGE.  - NO INCREASE IN INTRAEPITHELIAL EOSINOPHILS (LESS THAN 2 PER HPF).  - NEGATIVE FOR DYSPLASIA AND MALIGNANCY.   D. COLON POLYP, RECTOSIGMOID; COLD SNARE:  - HYPERPLASTIC POLYP.  - NEGATIVE FOR DYSPLASIA AND MALIGNANCY.  Past Medical History:  Diagnosis Date  . Anxiety   . Arthritis   . Asthma   . Cancer ([place])   . Clotting disorder ([place])   . COPD (chronic obstructive pulmonary disease) ([place])   . Depression   . Diabetes mellitus without complication ([place])   . Family history of leukemia   . Family history of lymphoma   . Family history of skin cancer   . Family history of thyroid cancer   . GERD (gastroesophageal reflux disease)   . History of esophageal cancer   . Hyperlipidemia   . Hypertension   . Neuromuscular disorder ([place])   . Oxygen deficiency   . Seizures ([place])   . Sleep apnea   . Thyroid disease   . Von Willebrand disease, type I [facility])     Past Surgical History:  Procedure Laterality Date  . ABDOMINAL HYSTERECTOMY    . APPENDECTOMY    . COLON SURGERY    . COLONOSCOPY WITH PROPOFOL N/A 12/21/2019   Procedure: COLONOSCOPY WITH PROPOFOL;  Surgeon: Lin Landsman, MD;  Location[facility] ENDOSCOPY;  Service: Gastroenterology;  Laterality: N/A;  . ESOPHAGOGASTRODUODENOSCOPY (EGD) WITH PROPOFOL N/A 12/21/2019   Procedure: ESOPHAGOGASTRODUODENOSCOPY (EGD) WITH PROPOFOL;  Surgeon: Lin Landsman, MD;  Location[facility] ENDOSCOPY;  Service: Gastroenterology;  Laterality: N/A;  . HERNIA REPAIR    . KNEE SURGERY    . tonsils surgery      Current Outpatient Medications:  .  ACCU-CHEK GUIDE test strip, USE TO TEST BLOOD SUGAR FOUR TIMES DAILY, Disp: 200 each, Rfl: 0 .  Accu-Chek Softclix Lancets lancets, USE TO TEST BLOOD SUGAR FOUR TIMES DAILY, Disp: 100 each, Rfl: 0 .  albuterol (PROVENTIL) (2.5 MG/3ML) 0.083%  nebulizer solution, Take 3 mLs (2.5 mg total) by nebulization every 6 (six) hours as needed for wheezing or shortness of breath., Disp: 75 mL, Rfl: 12 .  albuterol (VENTOLIN HFA) 108 (90 Base) MCG/ACT inhaler, Inhale 2 puffs into the lungs every 6 (six) hours as needed for wheezing or shortness of breath., Disp: 18 g, Rfl: 4 .  amLODipine (NORVASC) 5 MG tablet, Take 1 tablet (5 mg total) by mouth daily., Disp: 90 tablet, Rfl: 4 .  amphetamine-dextroamphetamine (ADDERALL) 12.5 MG tablet, Take 12.5 mg by mouth 2 (two) times daily., Disp: , Rfl:  .  atenolol (TENORMIN) 50 MG tablet, Take 1 tablet (50 mg total) by mouth daily., Disp: 90 tablet, Rfl: 4 .  atorvastatin (LIPITOR) 40 MG tablet, Take 1 tablet (40 mg total) by mouth at bedtime., Disp: 90 tablet, Rfl: 4 .  baclofen (LIORESAL) 20 MG tablet, Take 1 tablet (20 mg total) by mouth 2 (two) times daily., Disp: 180 each, Rfl: 4 .  Blood Glucose Monitoring Suppl (ACCU-CHEK GUIDE ME) w/Device KIT, USE TO TEST BLOOD SUGAR FOUR TIMES DAILY, Disp: 1 kit, Rfl: 0 .  Cholecalciferol (VITAMIN D3) 1.25 MG (50000 UT) CAPS, TAKE 1 CAPSULE BY MOUTH ONCE A WEEK FOR  8  WEEKS AND THEN  STOP  RETURN  TO  OFFICE  FOR  LAB  DRAW, Disp: 8 capsule, Rfl: 0 .  diazepam (VALIUM)  5 MG tablet, Take 5 mg by mouth every 6 (six) hours as needed for anxiety., Disp: , Rfl:  .  DULoxetine (CYMBALTA) 30 MG capsule, Take 1 capsule (30 mg total) by mouth 2 (two) times daily., Disp: 180 capsule, Rfl: 4 .  Fluticasone-Salmeterol (ADVAIR) 250-50 MCG/DOSE AEPB, Inhale 1 puff into the lungs daily., Disp: 60 each, Rfl: 4 .  gabapentin (NEURONTIN) 100 MG capsule, , Disp: , Rfl:  .  hydrOXYzine (ATARAX/VISTARIL) 25 MG tablet, Take 1 tablet (25 mg total) by mouth 3 (three) times daily., Disp: 270 tablet, Rfl: 4 .  insulin glargine (LANTUS) 100 UNIT/ML Solostar Pen, Inject 42 Units into the skin at bedtime., Disp: 15 mL, Rfl: 11 .  insulin lispro (HUMALOG KWIKPEN) 100 UNIT/ML KwikPen, Inject 4 to  18 units subcutaneously up to 3-4 times a day., Disp: 15 mL, Rfl: 11 .  levothyroxine (SYNTHROID) 200 MCG tablet, Take 1 tablet (200 mcg total) by mouth daily. With 25 mcg. (total 225 mcg daily), Disp: 90 tablet, Rfl: 4 .  levothyroxine (SYNTHROID) 25 MCG tablet, Take 25 mcg by mouth daily before breakfast. Take in addition to the 200 mg, Disp: , Rfl:  .  polyethylene glycol powder (GLYCOLAX/MIRALAX) 17 GM/SCOOP powder, Take 255 g by mouth daily., Disp: 255 g, Rfl: 3 .  Tiotropium Bromide Monohydrate (SPIRIVA RESPIMAT) 2.5 MCG/ACT AERS, Inhale 2 puffs into the lungs daily., Disp: 8 g, Rfl: 0 .  Tiotropium Bromide Monohydrate (SPIRIVA RESPIMAT) 2.5 MCG/ACT AERS, Inhale 2 puffs into the lungs daily., Disp: 4 g, Rfl: 11 .  tiZANidine (ZANAFLEX) 4 MG tablet, Take 1 tablet (4 mg total) by mouth 3 (three) times daily as needed., Disp: 270 tablet, Rfl: 4 .  rifaximin (XIFAXAN) 550 MG TABS tablet, Take 1 tablet (550 mg total) by mouth 3 (three) times daily for 14 days., Disp: 42 tablet, Rfl: 0   Family History  Problem Relation Age of Onset  . Lung cancer Mother 13  . Cancer Mother        skin cancer;   . Heart disease Mother   . Alzheimer's disease Father   . Cancer Father        skin cancer  . Hypertension Father   . Hip fracture Brother   . Hypertension Brother   . Skin cancer Brother   . Lymphoma Paternal Grandmother   . Alcohol abuse Brother   . Hypertension Brother   . Other Brother        large tumor on thigh removed in [place]  . Multiple myeloma Paternal Uncle   . Von Willebrand disease Paternal Aunt        and carcinoid disease  . Skin cancer Maternal Aunt   . Thyroid cancer Other   . Skin cancer Maternal Aunt   . Cancer Paternal Uncle        unk type  . Liver cancer Paternal Uncle   . Esophageal cancer Paternal Aunt   . Colon polyps Daughter 44     Social History   Tobacco Use  . Smoking status: Current Every Day Smoker    Packs/day: 0.50    Years: 8.00    Pack years:  4.00    Types: Cigarettes  . Smokeless tobacco: Never Used  . Tobacco comment: 4 Cigs daily--12/20/2019  Vaping Use  . Vaping Use: Never used  Substance Use Topics  . Alcohol use: Never  . Drug use: Not Currently    Types: Marijuana    Comment: medical marijuana card  Allergies as of 01/15/2020 - Review Complete 01/15/2020  Allergen Reaction Noted  . Latex  07/21/2017  . Tetanus antitoxin  09/21/2017  . Other Other (See Comments), Rash, and Swelling 03/28/2018  . Tramadol Rash 07/21/2017    Review of Systems:    All systems reviewed and negative except where noted in HPI.   Physical Exam:  BP (!) 154/96 (BP Location: Left Arm, Patient Position: Sitting, Cuff Size: Normal)   Pulse 86   Temp 97.6 F (36.4 C) (Oral)  No LMP recorded. Patient has had a hysterectomy.  General:   Alert,  Well-developed, well-nourished, pleasant and cooperative in NAD Head:  Normocephalic and atraumatic. Eyes:  Sclera clear, no icterus.   Conjunctiva pink. Ears:  Normal auditory acuity. Nose:  No deformity, discharge, or lesions. Mouth:  No deformity or lesions,oropharynx pink & moist. Neck:  Supple; no masses or thyromegaly. Lungs:  Respirations even and unlabored.  Clear throughout to auscultation.   No wheezes, crackles, or rhonchi. No acute distress. Heart:  Regular rate and rhythm; no murmurs, clicks, rubs, or gallops. Abdomen:  Normal bowel sounds. Soft, obese, abdominal striae in the lower abdomen, non-tender and severely distended, tympanic to percussion without masses, hepatosplenomegaly or hernias noted.  No guarding or rebound tenderness.   Rectal: Not performed Msk:  Symmetrical without gross deformities. Good, equal movement & strength bilaterally. Pulses:  Normal pulses noted. Extremities:  No clubbing or edema.  No cyanosis. Neurologic:  Alert and oriented x3;  grossly normal neurologically. Skin:  Intact without significant lesions or rashes. No jaundice. Psych:  Alert and  cooperative. Normal mood and affect.  Imaging Studies: No abdominal imaging  Assessment and Plan:   Margaret Church is a 54 y.o. female with history of insulin-dependent diabetes, thyroid cancer s/p thyroidectomy, carcinoid of the appendix s/p colectomy, history of colon cancer s/p colectomy neoadjuvant chemo is seen in consultation for symptoms of dyspepsia, bile duct stone based on CT scan, chronic constipation and abdominal bloating  Dyspepsia as well as chronic GERD upper endoscopy with esophageal, gastric and duodenal biopsies are unremarkable  ?  Bile duct stone, elevated alkaline phosphatase: Patient also reports history of pancreatitis in the past Recommend MRI/MRCP for further evaluation, if positive, recommend ERCP Repeat alkaline phosphatase levels were normal  History of colon cancer s/p colectomy Colonoscopy is unremarkable  Abdominal bloating and chronic constipation Patient tried MiraLAX bowel prep for bowel cleanout Linzess 155mg samples resulted in dizziness and diarrhea  Patient's main concern is abdominal bloating, therefore recommend empiric trial of 2 weeks course of rifaximin for bacterial overgrowth  Follow up in 3 months   RCephas Darby MD

## 2020-01-16 ENCOUNTER — Telehealth: Payer: Self-pay

## 2020-01-16 NOTE — Telephone Encounter (Signed)
Did PA through cover my meds for Xifaxan 550mg . Medication was approved through patient insurance on cover my meds

## 2020-01-16 NOTE — Telephone Encounter (Signed)
Patient verbalized understanding of the time of her MRCP and the instructions

## 2020-01-16 NOTE — Telephone Encounter (Signed)
Sent mychart message

## 2020-01-16 NOTE — Telephone Encounter (Signed)
Ginger States the MRCP was approved till 01/02/2020 to have her scan. She states she called and got it extended till then and she do not know why they would tell her it was not approved. Ginger said to get the scan rescheduled and do it at least 2 weeks out. Got patient scheduled for 01/30/2020 at 11:00am at [facility]. Patient needs to arrived at 10:30am and nothing to eat or drink 4 hours before the scan.  Tried to call patient and left a message for call back

## 2020-01-17 ENCOUNTER — Other Ambulatory Visit: Payer: Self-pay

## 2020-01-29 NOTE — Telephone Encounter (Signed)
Talk to ginger and she states she did the prior approval for the scan 2 weeks ago and the request is still pending. She said we would need to. Called patient and left a message for call back. Called [facility] scheduled and got patient rescheduled to 02/06/2020 at 9:00am. [facility] [place] [facility]. Nothing to eat or drink 4 hours before procedure

## 2020-01-29 NOTE — Telephone Encounter (Signed)
Called and left a message for call back. Sent mychart message  °

## 2020-01-30 ENCOUNTER — Ambulatory Visit: Payer: Medicaid Other

## 2020-01-30 NOTE — Telephone Encounter (Signed)
Informed patient that Scan was approved by patient insurance

## 2020-01-30 NOTE — Telephone Encounter (Signed)
3rd unsuccessful outreach  

## 2020-01-30 NOTE — Chronic Care Management (AMB) (Signed)
  Care Management   Note  01/30/2020 Name: Margaret Church MRN: [id number] DOB: 05-15-1965  Margaret Church is a 54 y.o. year old female who is a primary care patient of Venita Lick, NP and is actively engaged with the care management team. I reached out to [name] by phone today to assist with re-scheduling a follow up visit with the RN Case Manager Licensed Clinical Social Worker  Follow up plan: Unable to make contact on outreach attempts x 3. PCP Marnee Guarneri, NP  notified via routed documentation in medical record.   Noreene Larsson, [place], [place], [place] [address] Direct Dial: [phone] [email or web address]: [email or web address]

## 2020-02-06 ENCOUNTER — Other Ambulatory Visit: Payer: Self-pay

## 2020-02-06 ENCOUNTER — Other Ambulatory Visit: Payer: Self-pay | Admitting: Gastroenterology

## 2020-02-06 ENCOUNTER — Ambulatory Visit
Admission: RE | Admit: 2020-02-06 | Discharge: 2020-02-06 | Disposition: A | Discharge status: Home / Self Care | Payer: Medicaid Other | Source: Ambulatory Visit | Attending: Gastroenterology | Admitting: Gastroenterology

## 2020-02-06 DIAGNOSIS — K8050 Calculus of bile duct without cholangitis or cholecystitis without obstruction: Secondary | ICD-10-CM

## 2020-02-06 MED ORDER — GADOBUTROL 1 MMOL/ML IV SOLN
9.0000 mL | Freq: Once | INTRAVENOUS | Status: AC | PRN
  Administered 2020-02-06: 10:00:00 9 mL via INTRAVENOUS

## 2020-02-21 DIAGNOSIS — G2581 Restless legs syndrome: Secondary | ICD-10-CM | POA: Diagnosis not present

## 2020-02-21 DIAGNOSIS — G35 Multiple sclerosis: Secondary | ICD-10-CM | POA: Diagnosis not present

## 2020-02-21 DIAGNOSIS — Z87820 Personal history of traumatic brain injury: Secondary | ICD-10-CM | POA: Diagnosis not present

## 2020-02-21 DIAGNOSIS — R262 Difficulty in walking, not elsewhere classified: Secondary | ICD-10-CM | POA: Diagnosis not present

## 2020-02-21 NOTE — Progress Notes (Signed)
Today the history is gathered from:  100% - patient   0% - patient alone today    RECORDS SUMMARY:  No new records on file.     REFERRING PHYSICIAN: [facility] Dr. Harvest Dark  PRIMARY CARE PHYSICIAN:  [facility]    IMPRESSION/PLAN   Kristin Williamson is a 55 y.o. female presenting for evaluation of    MULTIPLE SCLEROSIS /DIFFICULTY WALKING  - Ongoing.  - Patient with MS, feeling very fatigued. Having tingling and numbness on the left side of her face. Also having muscle spasms. Has type 1 diabetes. Reports that home health recommended that she obtain an electric wheelchair.   - Reviewed brain MRI with patient, results suggest no new active lesions.   - Will send a referral to dietician to help manage diabetes.   - Symptoms of pain and tingling around the left eye, nose, and face may be consistent with trigeminal neuralgia.   - Continue taking Adderall 12.5 mg twice daily, refilled.   - Continue taing diazepam 5 mg twice daily as needed, refilled.   - Start taking carbamazepine 100 mg twice daily for one week, then increase to 100 mg three times per day.   - Will look into prescribing an electric wheelchair and request records from home health for the recommendations.   - Declines starting MS medication today due to concerns about side effects and     HISTORY OF TBI/ SEIZURE  - Stable.   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 Having shaking episodes a couple times per week.  - Reviewed extended monitoring EEG with patient, no abnormalities identified.   - Start carbamazepine, as stated above.     RESTLESS LEGS/ SLEEP DIFFICULTY / FATIGUE  - Ongoing.  - Patient with restless legs, fatigue, and sleep difficulty. Has some stressors at home.   - Continue Adderall, as stated above.   - Continue diazepam, as stated above.   - Discussed importance of controlling blood sugars for fatigue.     Follow up with Dr. Malvin Johns in 2-3 months    p=4    CHIEF COMPLAINT & HPI   Kristin Williamson is a  55 y.o. female presenting for evaluation of:  Chief Complaint   Patient presents with   . Multiple Sclerosis   . Restless Legs   . Difficulty Walking   . Fatigue   . Seizures      MULTIPLE SCLEROSIS   Patient reports that the change of weather has caused her to feel very weak, have aches, pain, and muscle spasms. She is having the muscle spasms in her arm, stomach, and legs. She has tingling and numbness on the left side of her face near her nose. She is also having tooth pain and sensitivity on the left side. She is having tearing in the eyes.     HISTORY OF TBI/ SEIZURE  Patient completed extended EEG since last visit. She continues to have shaking episodes a couple times per week, especially when she over exerts herself.     RESTLESS LEGS/ SLEEP DIFFICULTY  Patient reports she has been very fatigued even when taking adderall. She is not sure if this is an accumulation of the holiday stress, her mother passing, and caring for her granddaughter who is 68 months old. She says she could sleep all day. She sleeps a lot. Reports that she will have a couple days where she will not be able to sleep at all and then  days where she could sleep a lot. She is not taking gabapentin because it was causing headaches, imbalance, brain fog, and tingling.       DATA SUMMARY:  12/04/2019  EXTENDED EEG  IMPRESSION:  This extended in-office continuously monitored video EEG in the awake and asleep states is within normal limits.    10/20/2019  ROUTINE EEG  IMPRESSION:  This routine EEG in the awake and drowsy states is within normal limits.    Brain MRI 11/11/2019  IMPRESSION:   Supratentorial white matter signal abnormalities, compatible with   multiple sclerosis.     No evidence of active demyelination.        09/01/19   Creatinine = 0.8  GFR = 81  Vitamin B12 = 648  A1c = 12.9   Drug screen within normal limits      VISIT SUMMARIES:  02/21/2020: Symptoms ongoing. Continue taking Adderall 12.5 mg twice daily, refilled.  Continue taing  diazepam 5 mg twice daily as needed, refilled. Will send a referral to dietician to help manage diabetes.    11/20/2019: Symptoms ongoing. Continue Adderall 12.5 mg twice daily, refilled. Continue taking diazepam 5 mg twice daily as needed, refilled. We will order a 3 hour EEG to evaluate for seizures.     10/11/19: Patient with MS, history of TBI, seizure, restless legs, and sleep difficulty, new to me. Decrease Topamax by 50 mg every two weeks until discontinued. Continue Adderall 12.5 mg twice daily and   Valium 5 mg twice daily. Schedule brain MRI without contrast to evaluate for MS lesions. Send referral to [facility] to establish care. Send referral for home health assessment. Schedule routine EEG to evaluate for seizure-like activity.     MEDICATIONS  Outpatient Medications Marked as Taking for the 02/21/20 encounter (Telemedicine) with Alphonzo Lemmings, MD   Medication Sig   . albuterol 90 mcg/actuation inhaler Inhale 2 inhalations into the lungs   . amLODIPine (NORVASC) 5 MG tablet Take 5 mg by mouth once daily   . atenoloL (TENORMIN) 50 MG tablet Take 50 mg by mouth nightly   . atorvastatin (LIPITOR) 40 MG tablet Take 40 mg by mouth once daily   . baclofen (LIORESAL) 20 MG tablet Take 20 mg by mouth 2 (two) times daily   . cholecalciferol (VITAMIN D3) 1,250 mcg (50,000 unit) capsule Take 50,000 Units by mouth once a week   . dextroamphetamine-amphetamine (ADDERALL) 12.5 MG tablet Take 1 tablet (12.5 mg total) by mouth 2 (two) times daily   . diazePAM (VALIUM) 5 MG tablet Take 5 milligrams twice daily as needed   . DULoxetine (CYMBALTA) 30 MG DR capsule Take 30 mg by mouth 2 (two) times daily   . hydrOXYzine (ATARAX) 25 MG tablet Take 25 mg by mouth 3 (three) times daily as needed for Itching   . insulin lispro (HUMALOG JUNIOR KWIKPEN U-100) 100 unit/mL inph Inject subcutaneously 4-18 units three to four times daily   . ipratropium-albuteroL (DUO-NEB) nebulizer solution Take 3 mLs by nebulization  once daily as needed for Wheezing   . LANTUS SOLOSTAR U-100 INSULIN pen injector (concentration 100 units/mL) Inject subcutaneously nightly   . levothyroxine (SYNTHROID) 200 MCG tablet Take 200 mcg by mouth once daily Take on an empty stomach with a glass of water at least 30-60 minutes before breakfast.   . miscellaneous medical supply Misc Assessment for power wheelchair and shower chair       ALLERGIES  Allergies   Allergen Reactions   .  Dpt-Haemophilus Ps(Tet.Conj.) Rash   . Gabapentin Dizziness   . Latex, Natural Rubber Swelling   . Other Rash     SURGICAL STAPLES   . Tramadol Itching         EXAM     There were no vitals filed for this visit.  There is no height or weight on file to calculate BMI.     We were not able to do thorough physical exam during this televisit. Neurological exam is a crucial part of patient evaluation and lack of it can lead to misdiagnosis or missed diagnosis. If patient has concerns, they should consider making in person appointment with the provider. Provider should not be liable for consequences of lack of in person exam.    Unable to complete an exam today due to COVID-19 precautions. Exam below completed at last in office visit.     GENERAL:  Pleasant female, in no apparent distress today Normocephalic and atraumatic.    MUSCULOSKELETAL:  Bulk - Obese  Tone - Normal  Pronator Drift - Absent bilaterally.  Ambulation - Gait and station is severely unsteady with the physician, requires 1 assist the entire time.  However, it is noted that patient does not come in with an assistive walking device such as cane or walker and is able to walk in and walk out of the visit without assistance.       PAST MEDICAL HISTORY  Past Medical History:   Diagnosis Date   . Carcinoid syndrome (CMS-HCC)    . COPD (chronic obstructive pulmonary disease) (CMS-HCC)    . Diabetes mellitus without complication (CMS-HCC)    . History of cancer    . Multiple sclerosis (CMS-HCC)    . Seizures (CMS-HCC)    . Von  Willebrand disease (CMS-HCC)        PAST SURGICAL HISTORY  Past Surgical History:   Procedure Laterality Date   . APPENDECTOMY     . HYSTERECTOMY VAGINAL     . THYROIDECTOMY TOTAL     . TONSILLECTOMY Bilateral        FAMILY HISTORY  Family History   Problem Relation Age of Onset   . Multiple sclerosis Mother    . Cancer Mother    . Parkinsonism Mother    . High blood pressure (Hypertension) Mother    . Depression Mother    . Alzheimer's disease Father    . Cancer Father    . Diabetes Father    . Diabetes Daughter    . Diabetes Maternal Grandmother        SOCIAL HISTORY   Social History     Tobacco Use   . Smoking status: Current Every Day Smoker     Types: Cigarettes   . Smokeless tobacco: Never Used   Substance Use Topics   . Alcohol use: Not Currently   . Drug use: Not on file         REVIEW OF SYSTEMS:   13 system ROS form was given to the patient to complete and I have reviewed it.  The form was sent for scan to the patient's EHR.  Pertinent positives and negatives are mentioned above in the HPI and all other systems are negative.      DATA   I have personally reviewed all of the data outlined below both prior to the appointment and during the appointment with the patient as appropriate.    No results found for any previous visit.     11/11/2019  BRAIN MRI W AND WO     EXAM:   MRI HEAD WITHOUT CONTRAST     TECHNIQUE:   Multiplanar, multiecho pulse sequences of the brain and surrounding   structures were obtained without intravenous contrast.     COMPARISON: None.     FINDINGS:   Brain: No diffusion-weighted signal abnormality. No intracranial   hemorrhage. Scattered T2/FLAIR hyperintense foci involving the   periventricular and juxtacortical white matter.     Cerebral volume is within normal limits. Normal appearance of the   corpus callosum. No midline shift, ventriculomegaly or extra-axial   fluid collection. No mass lesion.     Vascular: Normal flow voids. Right occipital developmental venous   anomaly.      Skull and upper cervical spine: Normal marrow signal.     Sinuses/Orbits: Normal orbits. Mild ethmoid sinus mucosal   thickening. No mastoid effusion.     Other: None.     IMPRESSION:   Supratentorial white matter signal abnormalities, compatible with   multiple sclerosis.     No evidence of active demyelination.       Electronically Signed   By: Stana Bunting M.D.   On: 11/11/2019 20:24        No follow-ups on file.    Payor: BCBS Scientist, physiological / Plan: [place] [employer] BL[place] / Product Type: Medicaid /       This note is partially prepared by Lily Peer, Scribe, in the presence of and acting as the scribe for Dr. Theora Master.     I have reviewed, edited and added to the note as needed to reflect my best personal medical judgment.        Dr. Theora Master, MD  [facility]  [facility]  [place], [place]  Ph:  [phone]  Fax:  [phone]    This video encounter was conducted with the patient's (or proxy's) verbal consent via secure, interactive audio and video telecommunications while in clinic/office/hospital.    The patient (or proxy) was instructed to have this encounter in a suitably private space and to only have persons present to whom they give permission to participate. In addition, patient identity was confirmed by use of name plus an additional identifier.      This visit was coded based on medical decision making (MDM).

## 2020-03-18 ENCOUNTER — Inpatient Hospital Stay: Payer: Medicaid Other

## 2020-03-21 ENCOUNTER — Inpatient Hospital Stay: Payer: Medicaid Other | Attending: Internal Medicine

## 2020-03-21 DIAGNOSIS — Z8371 Family history of colonic polyps: Secondary | ICD-10-CM | POA: Insufficient documentation

## 2020-03-21 DIAGNOSIS — R140 Abdominal distension (gaseous): Secondary | ICD-10-CM | POA: Insufficient documentation

## 2020-03-21 DIAGNOSIS — K219 Gastro-esophageal reflux disease without esophagitis: Secondary | ICD-10-CM | POA: Insufficient documentation

## 2020-03-21 DIAGNOSIS — M549 Dorsalgia, unspecified: Secondary | ICD-10-CM | POA: Insufficient documentation

## 2020-03-21 DIAGNOSIS — Z888 Allergy status to other drugs, medicaments and biological substances status: Secondary | ICD-10-CM | POA: Insufficient documentation

## 2020-03-21 DIAGNOSIS — E119 Type 2 diabetes mellitus without complications: Secondary | ICD-10-CM | POA: Insufficient documentation

## 2020-03-21 DIAGNOSIS — Z8501 Personal history of malignant neoplasm of esophagus: Secondary | ICD-10-CM | POA: Insufficient documentation

## 2020-03-21 DIAGNOSIS — D680 Von Willebrand's disease: Secondary | ICD-10-CM | POA: Insufficient documentation

## 2020-03-21 DIAGNOSIS — R109 Unspecified abdominal pain: Secondary | ICD-10-CM | POA: Insufficient documentation

## 2020-03-21 DIAGNOSIS — Z808 Family history of malignant neoplasm of other organs or systems: Secondary | ICD-10-CM | POA: Insufficient documentation

## 2020-03-21 DIAGNOSIS — Z811 Family history of alcohol abuse and dependence: Secondary | ICD-10-CM | POA: Insufficient documentation

## 2020-03-21 DIAGNOSIS — D751 Secondary polycythemia: Secondary | ICD-10-CM | POA: Insufficient documentation

## 2020-03-21 DIAGNOSIS — Z9049 Acquired absence of other specified parts of digestive tract: Secondary | ICD-10-CM | POA: Insufficient documentation

## 2020-03-21 DIAGNOSIS — Z801 Family history of malignant neoplasm of trachea, bronchus and lung: Secondary | ICD-10-CM | POA: Insufficient documentation

## 2020-03-21 DIAGNOSIS — Z8249 Family history of ischemic heart disease and other diseases of the circulatory system: Secondary | ICD-10-CM | POA: Insufficient documentation

## 2020-03-21 DIAGNOSIS — Z818 Family history of other mental and behavioral disorders: Secondary | ICD-10-CM | POA: Insufficient documentation

## 2020-03-21 DIAGNOSIS — Z85038 Personal history of other malignant neoplasm of large intestine: Secondary | ICD-10-CM | POA: Insufficient documentation

## 2020-03-21 DIAGNOSIS — J449 Chronic obstructive pulmonary disease, unspecified: Secondary | ICD-10-CM | POA: Insufficient documentation

## 2020-03-21 DIAGNOSIS — R110 Nausea: Secondary | ICD-10-CM | POA: Insufficient documentation

## 2020-03-21 DIAGNOSIS — Z885 Allergy status to narcotic agent status: Secondary | ICD-10-CM | POA: Insufficient documentation

## 2020-03-21 DIAGNOSIS — Z9221 Personal history of antineoplastic chemotherapy: Secondary | ICD-10-CM | POA: Insufficient documentation

## 2020-03-21 DIAGNOSIS — Z800 Family history of malignant neoplasm of digestive organs: Secondary | ICD-10-CM | POA: Insufficient documentation

## 2020-03-21 DIAGNOSIS — Z79899 Other long term (current) drug therapy: Secondary | ICD-10-CM | POA: Insufficient documentation

## 2020-03-21 DIAGNOSIS — I10 Essential (primary) hypertension: Secondary | ICD-10-CM | POA: Insufficient documentation

## 2020-03-21 DIAGNOSIS — G8929 Other chronic pain: Secondary | ICD-10-CM | POA: Insufficient documentation

## 2020-03-21 DIAGNOSIS — F17210 Nicotine dependence, cigarettes, uncomplicated: Secondary | ICD-10-CM | POA: Insufficient documentation

## 2020-03-21 DIAGNOSIS — M1990 Unspecified osteoarthritis, unspecified site: Secondary | ICD-10-CM | POA: Insufficient documentation

## 2020-03-25 ENCOUNTER — Inpatient Hospital Stay: Payer: Medicaid Other | Admitting: Internal Medicine

## 2020-03-26 ENCOUNTER — Telehealth: Payer: Self-pay | Admitting: Nurse Practitioner

## 2020-03-26 NOTE — Telephone Encounter (Signed)
Please advise 

## 2020-03-26 NOTE — Telephone Encounter (Signed)
Cherie w/ [facility] calling to advise the pt called to have the following refills today 1. tiZANidine (ZANAFLEX) 4 MG tablet 2. baclofen (LIORESAL) 20 MG tablet Newest one 3. diazepam (VALIUM) 5 MG tablet    (ordred through Dr Melrose Nakayama) She wants to know if ok to fill all these meds today at one time?  [place], [place]

## 2020-03-26 NOTE — Telephone Encounter (Signed)
Is okay to fill, she has not been seen in office in some time and will need visit for any further refills on medications I fill in office.  Has been on this regimen for some time and tolerated.

## 2020-03-26 NOTE — Telephone Encounter (Signed)
Pharmacy notified and informed that she would need an appt for refills.

## 2020-03-27 ENCOUNTER — Other Ambulatory Visit: Payer: Self-pay | Admitting: Nurse Practitioner

## 2020-04-03 ENCOUNTER — Inpatient Hospital Stay: Payer: Medicaid Other

## 2020-04-03 DIAGNOSIS — Z801 Family history of malignant neoplasm of trachea, bronchus and lung: Secondary | ICD-10-CM | POA: Diagnosis not present

## 2020-04-03 DIAGNOSIS — R110 Nausea: Secondary | ICD-10-CM | POA: Diagnosis not present

## 2020-04-03 DIAGNOSIS — D6800 Von Willebrand disease, unspecified: Secondary | ICD-10-CM

## 2020-04-03 DIAGNOSIS — Z808 Family history of malignant neoplasm of other organs or systems: Secondary | ICD-10-CM | POA: Diagnosis not present

## 2020-04-03 DIAGNOSIS — Z818 Family history of other mental and behavioral disorders: Secondary | ICD-10-CM | POA: Diagnosis not present

## 2020-04-03 DIAGNOSIS — K219 Gastro-esophageal reflux disease without esophagitis: Secondary | ICD-10-CM | POA: Diagnosis not present

## 2020-04-03 DIAGNOSIS — Z8249 Family history of ischemic heart disease and other diseases of the circulatory system: Secondary | ICD-10-CM | POA: Diagnosis not present

## 2020-04-03 DIAGNOSIS — R140 Abdominal distension (gaseous): Secondary | ICD-10-CM | POA: Diagnosis not present

## 2020-04-03 DIAGNOSIS — Z811 Family history of alcohol abuse and dependence: Secondary | ICD-10-CM | POA: Diagnosis not present

## 2020-04-03 DIAGNOSIS — Z888 Allergy status to other drugs, medicaments and biological substances status: Secondary | ICD-10-CM | POA: Diagnosis not present

## 2020-04-03 DIAGNOSIS — M1990 Unspecified osteoarthritis, unspecified site: Secondary | ICD-10-CM | POA: Diagnosis not present

## 2020-04-03 DIAGNOSIS — Z885 Allergy status to narcotic agent status: Secondary | ICD-10-CM | POA: Diagnosis not present

## 2020-04-03 DIAGNOSIS — R109 Unspecified abdominal pain: Secondary | ICD-10-CM | POA: Diagnosis not present

## 2020-04-03 DIAGNOSIS — Z9049 Acquired absence of other specified parts of digestive tract: Secondary | ICD-10-CM | POA: Diagnosis not present

## 2020-04-03 DIAGNOSIS — Z85850 Personal history of malignant neoplasm of thyroid: Secondary | ICD-10-CM

## 2020-04-03 DIAGNOSIS — G8929 Other chronic pain: Secondary | ICD-10-CM | POA: Diagnosis not present

## 2020-04-03 DIAGNOSIS — Z9221 Personal history of antineoplastic chemotherapy: Secondary | ICD-10-CM | POA: Diagnosis not present

## 2020-04-03 DIAGNOSIS — M549 Dorsalgia, unspecified: Secondary | ICD-10-CM | POA: Diagnosis not present

## 2020-04-03 DIAGNOSIS — J449 Chronic obstructive pulmonary disease, unspecified: Secondary | ICD-10-CM | POA: Diagnosis not present

## 2020-04-03 DIAGNOSIS — D751 Secondary polycythemia: Secondary | ICD-10-CM | POA: Diagnosis not present

## 2020-04-03 DIAGNOSIS — Z8501 Personal history of malignant neoplasm of esophagus: Secondary | ICD-10-CM | POA: Diagnosis not present

## 2020-04-03 DIAGNOSIS — Z85038 Personal history of other malignant neoplasm of large intestine: Secondary | ICD-10-CM

## 2020-04-03 DIAGNOSIS — Z79899 Other long term (current) drug therapy: Secondary | ICD-10-CM | POA: Diagnosis not present

## 2020-04-03 DIAGNOSIS — C7A00 Malignant carcinoid tumor of unspecified site: Secondary | ICD-10-CM

## 2020-04-03 DIAGNOSIS — I10 Essential (primary) hypertension: Secondary | ICD-10-CM | POA: Diagnosis not present

## 2020-04-03 DIAGNOSIS — F17210 Nicotine dependence, cigarettes, uncomplicated: Secondary | ICD-10-CM | POA: Diagnosis not present

## 2020-04-03 DIAGNOSIS — E119 Type 2 diabetes mellitus without complications: Secondary | ICD-10-CM | POA: Diagnosis not present

## 2020-04-03 LAB — COMPREHENSIVE METABOLIC PANEL
ALT: 38 U/L (ref 0–44)
AST: 27 U/L (ref 15–41)
Albumin: 3.9 g/dL (ref 3.5–5.0)
Alkaline Phosphatase: 124 U/L (ref 38–126)
Anion gap: 11 (ref 5–15)
BUN: 10 mg/dL (ref 6–20)
CO2: 27 mmol/L (ref 22–32)
Calcium: 8.9 mg/dL (ref 8.9–10.3)
Chloride: 97 mmol/L — ABNORMAL LOW (ref 98–111)
Creatinine, Ser: 0.55 mg/dL (ref 0.44–1.00)
GFR, Estimated: >60 mL/min (ref >60)
Glucose, Bld: 294 mg/dL — ABNORMAL HIGH (ref 70–99)
Potassium: 4 mmol/L (ref 3.5–5.1)
Sodium: 135 mmol/L (ref 135–145)
Total Bilirubin: 0.4 mg/dL (ref 0.3–1.2)
Total Protein: 7.9 g/dL (ref 6.5–8.1)

## 2020-04-03 LAB — CBC WITH DIFFERENTIAL/PLATELET
Abs Immature Granulocytes: 0.03 10*3/uL (ref 0.00–0.07)
Basophils Absolute: 0.1 10*3/uL (ref 0.0–0.1)
Basophils Relative: 1 %
Eosinophils Absolute: 0.2 10*3/uL (ref 0.0–0.5)
Eosinophils Relative: 3 %
HCT: 46.9 % — ABNORMAL HIGH (ref 36.0–46.0)
Hemoglobin: 16 g/dL — ABNORMAL HIGH (ref 12.0–15.0)
Immature Granulocytes: 0 %
Lymphocytes Relative: 23 %
Lymphs Abs: 1.8 10*3/uL (ref 0.7–4.0)
MCH: 28.8 pg (ref 26.0–34.0)
MCHC: 34.1 g/dL (ref 30.0–36.0)
MCV: 84.5 fL (ref 80.0–100.0)
Monocytes Absolute: 0.4 10*3/uL (ref 0.1–1.0)
Monocytes Relative: 5 %
Neutro Abs: 5.3 10*3/uL (ref 1.7–7.7)
Neutrophils Relative %: 68 %
Platelets: 240 10*3/uL (ref 150–400)
RBC: 5.55 MIL/uL — ABNORMAL HIGH (ref 3.87–5.11)
RDW: 12.7 % (ref 11.5–15.5)
WBC: 7.8 10*3/uL (ref 4.0–10.5)
nRBC: 0 % (ref 0.0–0.2)

## 2020-04-04 LAB — THYROGLOBULIN ANTIBODY: Thyroglobulin Antibody: <1 IU/mL (ref 0.0–0.9)

## 2020-04-04 LAB — CEA: CEA: 2.3 ng/mL (ref 0.0–4.7)

## 2020-04-04 LAB — THYROID PANEL WITH TSH
Free Thyroxine Index: 1.9 (ref 1.2–4.9)
T3 Uptake Ratio: 25 % (ref 24–39)
T4, Total: 7.4 ug/dL (ref 4.5–12.0)
TSH: 0.401 u[IU]/mL — ABNORMAL LOW (ref 0.450–4.500)

## 2020-04-09 ENCOUNTER — Inpatient Hospital Stay (HOSPITAL_BASED_OUTPATIENT_CLINIC_OR_DEPARTMENT_OTHER): Payer: Medicaid Other | Admitting: Internal Medicine

## 2020-04-09 ENCOUNTER — Other Ambulatory Visit: Payer: Self-pay

## 2020-04-09 VITALS — BP 135/88 | HR 77 | Temp 95.8°F | Resp 20

## 2020-04-09 DIAGNOSIS — D6800 Von Willebrand disease, unspecified: Secondary | ICD-10-CM

## 2020-04-09 DIAGNOSIS — C73 Malignant neoplasm of thyroid gland: Secondary | ICD-10-CM

## 2020-04-09 LAB — CHROMOGRANIN A: Chromogranin A (ng/mL): 39 ng/mL (ref 0.0–101.8)

## 2020-04-09 MED ORDER — AMOXICILLIN 500 MG PO CAPS: 500.0000 mg | ORAL_CAPSULE | Freq: Three times a day (TID) | ORAL | 0 refills | Status: DC

## 2020-04-09 NOTE — Assessment & Plan Note (Addendum)
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  Von Willebrand work-up negative.  Given ongoing easy bruising-complaints offered patient evaluation at a tertiary care center.  Patient declines.  # Erythrocytosis- Hb 15/HCT 17; clinically secondary; JAK2-NEGATIVE-suspect secondary.  Question OSA.  Defer to PCP regarding sleep study.  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  No clinical evidence of disease.  Chromogranin A normal.  # Hx of colon cancer [2009]- ? Stage II-CEA normal.  S/p colonoscopy 2021-negative.  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  Clinically NO evidence of recurrence.  Thyroid markers- negative.  TSH-0.3; normal T4/T3 [suppressive dose of Synthroid; goal TSH unclear].  Continue Synthroid at the current dose. Defer to endocrinology.  # Abdominal discomfort/ nausea- stable; followed by GI/Dr.Vanga.   # Tooth infection-prescription for amoxicillin amoxicllin; defer to dentistry for further evaluation.  DISPOSITION: # follow up in 6 months- MD; labs- cbc/cmp; thyroid profile/thyroid antibodies.- Dr.B  Cc: Jolene Cannady/Dr.Vanga

## 2020-04-09 NOTE — Progress Notes (Signed)
[place] NOTE  Patient Care Team: Venita Lick, NP as PCP - General (Nurse Practitioner) Vladimir Faster, [facility] as Pharmacist (Pharmacist) Greg Cutter, LCSW as Social Worker (Licensed Clinical Social Worker)  CHIEF COMPLAINTS/PURPOSE OF CONSULTATION:   # VWD [reported history; work-up multiple times negative 2013 & 2021]- on surveillaince-no bleeding noted status post thyroidectomy [2014] s/p colectomy [2005]; OCT 2021- The VWF:Ag is normal. The VWF:RCo is normal. 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   # Hx of colon cancer [2009]- ? Stage II- ? 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   # TAH [at age 10]/ tonsillectomy bleeding [?  Reported diagnosis of von Willebrand]; MS; DM   Oncology History   No history exists.     HISTORY OF PRESENTING ILLNESS:  Margaret Church 55 y.o.  female with a history of multiple malignancies; also with reported history of von Willebrand disease -is here for follow-up.   Patient had EGD/colonoscopy-the interim no acute process noted.  No bleeding complications noted.   Patient continues to admit of easy bruising.  Otherwise no blood in stools or black or stools.  Complains of moderate fatigue.  Chronic joint pains.  Patient also complains of abdominal discomfort/bloating which is currently improved after treatment with GI/Dr. Marius Ditch.  Patient's states that she was supposed to follow-up with endocrinology however unable to follow because of her travel to [place].    Review of Systems  Constitutional: Positive for malaise/fatigue. Negative for chills, diaphoresis, fever and weight loss.  HENT: Negative for nosebleeds and sore throat.   Eyes: Negative for double vision.  Respiratory:  Negative for cough, hemoptysis, sputum production, shortness of breath and wheezing.   Cardiovascular: Negative for chest pain, palpitations, orthopnea and leg swelling.  Gastrointestinal: Negative for abdominal pain, blood in stool, constipation, diarrhea, heartburn, melena, nausea and vomiting.  Genitourinary: Negative for dysuria, frequency and urgency.  Musculoskeletal: Positive for back pain and joint pain.  Skin: Negative.  Negative for itching and rash.  Neurological: Negative for dizziness, tingling, focal weakness, weakness and headaches.  Endo/Heme/Allergies: Bruises/bleeds easily.  Psychiatric/Behavioral: Negative for depression. The patient is not nervous/anxious and does not have insomnia.      MEDICAL HISTORY:  Past Medical History:  Diagnosis Date  . Anxiety   . Arthritis   . Asthma   . Cancer ([place])   . Clotting disorder ([place])   . COPD (chronic obstructive pulmonary disease) ([place])   . Depression   . Diabetes mellitus without complication ([place])   . Family history of leukemia   . Family history of lymphoma   . Family history of skin cancer   . Family history of thyroid cancer   . GERD (gastroesophageal reflux disease)   . History of esophageal cancer   . Hyperlipidemia   . Hypertension   . Neuromuscular disorder ([place])   . Oxygen deficiency   . Seizures ([place])   . Sleep apnea   . Thyroid disease   . Von Willebrand disease, type I ([place])     SURGICAL HISTORY: Past Surgical History:  Procedure Laterality Date  . ABDOMINAL HYSTERECTOMY    . APPENDECTOMY    . COLON SURGERY    . COLONOSCOPY WITH PROPOFOL N/A 12/21/2019   Procedure: COLONOSCOPY WITH PROPOFOL;  Surgeon: Lin Landsman, MD;  Location: [facility] ENDOSCOPY;  Service: Gastroenterology;  Laterality: N/A;  . ESOPHAGOGASTRODUODENOSCOPY (EGD) WITH PROPOFOL N/A 12/21/2019   Procedure: ESOPHAGOGASTRODUODENOSCOPY (EGD) WITH PROPOFOL;  Surgeon: Lin Landsman, MD;  Location: [facility] ENDOSCOPY;  Service:  Gastroenterology;  Laterality: N/A;  . HERNIA REPAIR    . KNEE SURGERY    . tonsils surgery      SOCIAL HISTORY: Social History   Socioeconomic History  . Marital status: Significant Other    Spouse name: Not on file  . Number of children: Not on file  . Years of education: Not on file  . Highest education level: Not on file  Occupational History  . Not on file  Tobacco Use  . Smoking status: Current Every Day Smoker    Packs/day: 0.50    Years: 8.00    Pack years: 4.00    Types: Cigarettes  . Smokeless tobacco: Never Used  . Tobacco comment: 4 Cigs daily--12/20/2019  Vaping Use  . Vaping Use: Never used  Substance and Sexual Activity  . Alcohol use: Never  . Drug use: Not Currently    Types: Marijuana    Comment: medical marijuana card  . Sexual activity: Yes  Other Topics Concern  . 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 Moved [place] closer to [place] ~ [place] ago [place] Social smoker 4-5 cig/ day; no alcohol; hx of marijuana/ palliative care [[place]]   Social Determinants of Health   Financial Resource Strain: Not on file  Food Insecurity: Not on file  Transportation Needs: Not on file  Physical Activity: Not on file  Stress: Not on file  Social Connections: Not on file  Intimate Partner Violence: Not on file    FAMILY HISTORY: Family History  Problem Relation Age of Onset  . Lung cancer Mother 86  . Cancer Mother        skin cancer;   . Heart disease Mother   . Alzheimer's disease Father   . Cancer Father        skin cancer  . Hypertension Father   . Hip fracture Brother   . Hypertension Brother   . Skin cancer Brother   . Lymphoma Paternal Grandmother   . Alcohol abuse Brother   . Hypertension Brother   . Other Brother        large tumor on thigh removed in [place]  . Multiple myeloma Paternal Uncle   . Von Willebrand disease Paternal Aunt         and carcinoid disease  . Skin cancer Maternal Aunt   . Thyroid cancer Other   . Skin cancer Maternal Aunt   . Cancer Paternal Uncle        unk type  . Liver cancer Paternal Uncle   . Esophageal cancer Paternal Aunt   . Colon polyps Daughter 71    ALLERGIES:  is allergic to latex, tetanus antitoxin, other, and tramadol.  MEDICATIONS:  Current Outpatient Medications  Medication Sig Dispense Refill  . ACCU-CHEK GUIDE test strip USE TO TEST BLOOD SUGAR FOUR TIMES DAILY 200 each 0  . Accu-Chek Softclix Lancets lancets USE TO TEST BLOOD SUGAR FOUR TIMES DAILY 100 each 0  . albuterol (PROVENTIL) (2.5 MG/3ML) 0.083% nebulizer solution Take 3 mLs (2.5 mg total) by nebulization every 6 (six) hours as needed for wheezing or shortness of breath. 75 mL 12  .  albuterol (VENTOLIN HFA) 108 (90 Base) MCG/ACT inhaler Inhale 2 puffs into the lungs every 6 (six) hours as needed for wheezing or shortness of breath. 18 g 4  . amLODipine (NORVASC) 5 MG tablet Take 1 tablet (5 mg total) by mouth daily. 90 tablet 4  . amoxicillin (AMOXIL) 500 MG capsule Take 1 capsule (500 mg total) by mouth 3 (three) times daily. 21 capsule 0  . amphetamine-dextroamphetamine (ADDERALL) 12.5 MG tablet Take 12.5 mg by mouth 2 (two) times daily.    Marland Kitchen atenolol (TENORMIN) 50 MG tablet Take 1 tablet (50 mg total) by mouth daily. 90 tablet 4  . baclofen (LIORESAL) 20 MG tablet Take 1 tablet (20 mg total) by mouth 2 (two) times daily. 180 each 4  . Blood Glucose Monitoring Suppl (ACCU-CHEK GUIDE ME) w/Device KIT USE TO TEST BLOOD SUGAR FOUR TIMES DAILY 1 kit 0  . diazepam (VALIUM) 5 MG tablet Take 5 mg by mouth every 6 (six) hours as needed for anxiety.    . DULoxetine (CYMBALTA) 30 MG capsule Take 1 capsule (30 mg total) by mouth 2 (two) times daily. 180 capsule 4  . Fluticasone-Salmeterol (ADVAIR) 250-50 MCG/DOSE AEPB Inhale 1 puff into the lungs daily. 60 each 4  . insulin glargine (LANTUS) 100 UNIT/ML Solostar Pen Inject 42  Units into the skin at bedtime. 15 mL 11  . insulin lispro (HUMALOG KWIKPEN) 100 UNIT/ML KwikPen Inject 4 to 18 units subcutaneously up to 3-4 times a day. 15 mL 11  . levothyroxine (SYNTHROID) 200 MCG tablet Take 1 tablet (200 mcg total) by mouth daily. With 25 mcg. (total 225 mcg daily) 90 tablet 4  . levothyroxine (SYNTHROID) 25 MCG tablet Take 25 mcg by mouth daily before breakfast. Take in addition to the 200 mg    . polyethylene glycol powder (GLYCOLAX/MIRALAX) 17 GM/SCOOP powder Take 255 g by mouth daily. 255 g 3  . Tiotropium Bromide Monohydrate (SPIRIVA RESPIMAT) 2.5 MCG/ACT AERS Inhale 2 puffs into the lungs daily. 8 g 0  . Tiotropium Bromide Monohydrate (SPIRIVA RESPIMAT) 2.5 MCG/ACT AERS Inhale 2 puffs into the lungs daily. 4 g 11   No current facility-administered medications for this visit.      Marland Kitchen  PHYSICAL EXAMINATION: ECOG PERFORMANCE STATUS: 0 - Asymptomatic  Vitals:   04/09/20 1015  BP: 135/88  Pulse: 77  Resp: 20  Temp: (!) 95.8 F (35.4 C)   Filed Weights    Physical Exam Constitutional:      Comments: Patient is alone.  Ambulating independently.  HENT:     Head: Normocephalic and atraumatic.     Mouth/Throat:     Pharynx: No oropharyngeal exudate.  Eyes:     Pupils: Pupils are equal, round, and reactive to light.  Cardiovascular:     Rate and Rhythm: Normal rate and regular rhythm.  Pulmonary:     Effort: Pulmonary effort is normal. No respiratory distress.     Breath sounds: Normal breath sounds. No wheezing.  Abdominal:     General: Bowel sounds are normal. There is no distension.     Palpations: Abdomen is soft. There is no mass.     Tenderness: There is no abdominal tenderness. There is no guarding or rebound.  Musculoskeletal:        General: No tenderness. Normal range of motion.     Cervical back: Normal range of motion and neck supple.  Skin:    General: Skin is warm.  Neurological:     Mental Status: She  is alert and oriented to  person, place, and time.  Psychiatric:        Mood and Affect: Affect normal.      LABORATORY DATA:  I have reviewed the data as listed Lab Results  Component Value Date   WBC 7.8 04/03/2020   HGB 16.0 (H) 04/03/2020   HCT 46.9 (H) 04/03/2020   MCV 84.5 04/03/2020   PLT 240 04/03/2020   Recent Labs    09/01/19 1104 09/11/19 1155 12/04/19 1426 12/08/19 1039 04/03/20 1125  NA 132* 135  --  134* 135  K 4.3 4.0  --  4.0 4.0  CL 93* 102  --  99 97*  CO2 22 23  --  26 27  GLUCOSE 367* 387*  --  428* 294*  BUN '11 13 8 13 10  ' CREATININE 0.82 0.73 0.60 0.64 0.55  CALCIUM 9.7 9.1  --  8.8* 8.9  GFRNONAA 81 >60 103 >60 >60  GFRAA 93 >60 119  --   --   PROT 7.7 8.4*  --  7.2 7.9  ALBUMIN 4.8 4.2  --  3.7 3.9  AST 15 16  --  21 27  ALT 23 27  --  30 38  ALKPHOS 188* 162*  --  117 124  BILITOT 0.6 0.7  --  0.6 0.4    RADIOGRAPHIC STUDIES: I have personally reviewed the radiological images as listed and agreed with the findings in the report. No results found.  ASSESSMENT & PLAN:   Von Willebrand disease ([place]) # VWD- as reported by pt-extensive review of records [2013-2016]-no excessive bleeding noted post surgical interventions.  Von Willebrand work-up negative.  Given ongoing easy bruising-complaints offered patient evaluation at a tertiary care center.  Patient declines.  # Erythrocytosis- Hb 15/HCT 17; clinically secondary; JAK2-NEGATIVE-suspect secondary.  Question OSA.  Defer to PCP regarding sleep study.  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  No clinical evidence of disease.  Chromogranin A normal.  # Hx of colon cancer [2009]- ? Stage II-CEA normal.  S/p colonoscopy 2021-negative.  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  Clinically NO evidence of recurrence.  Thyroid markers- negative.  TSH-0.3; normal T4/T3 [suppressive dose of Synthroid; goal TSH unclear].  Continue Synthroid at the current dose. Defer to  endocrinology.  # Abdominal discomfort/ nausea- stable; followed by GI/Dr.Vanga.   # Tooth infection-prescription for amoxicillin amoxicllin; defer to dentistry for further evaluation.  DISPOSITION: # follow up in 6 months- MD; labs- cbc/cmp; thyroid profile/thyroid antibodies.- Dr.B  Cc: Jolene Cannady/Dr.Vanga   All questions were answered. The patient knows to call the clinic with any problems, questions or concerns.    Cammie Sickle, MD 04/09/2020 3:11 PM

## 2020-04-16 ENCOUNTER — Encounter: Payer: Self-pay | Admitting: *Deleted

## 2020-04-16 ENCOUNTER — Ambulatory Visit: Payer: Medicaid Other | Admitting: Gastroenterology

## 2020-04-16 ENCOUNTER — Encounter: Payer: Self-pay | Admitting: Gastroenterology

## 2020-04-25 NOTE — Telephone Encounter (Signed)
sent 

## 2020-05-29 NOTE — Progress Notes (Signed)
Today the history is gathered from:  100% - patient   0% - patient alone today    RECORDS SUMMARY:  No new records on file.     REFERRING PHYSICIAN: [facility] Dr. Harvest Dark  PRIMARY CARE PHYSICIAN:  [facility]    IMPRESSION/PLAN   Kristin Williamson is a 55 y.o. female presenting for evaluation of    MULTIPLE SCLEROSIS /DIFFICULTY WALKING  - Ongoing.   - Patient with MS, having difficulty with urination and bowel movements. Also having numbness in the right pelvic area. Having pain in the legs. Having a lot of constipation.   - Patient is in contact with her GI physician about her constipation. History of diverticulitis.   - Will send urgent referral to GI for constipation with significant pain. Discussed that if symptoms get worse, she should go to the ER.   - Start taking Lyrica 25 mg twice daily for 1 week, then increase to 50 mg twice daily.   - Continue taking carbamazepine 100 mg three times daily.   - Continue taking diazepam 5 mg twice daily as needed, refilled.   - Continue taking Adderall 12.5 mg twice daily, refilled.   - Will send a prescription for electric wheelchair and shower chair.     HISTORY OF TBI/ SEIZURE  - Stable.   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 No new seizure episodes.   - Will continue to monitor.    RESTLESS LEGS/ SLEEP DIFFICULTY / FATIGUE   - Ongoing.   - Patient with restless legs, stiffness, and fatigue. Having leg cramping.   - Start Lyrica, as stated above.       Follow up with Dr. Malvin Johns in 2-3 months    p=4    CHIEF COMPLAINT & HPI   Kristin Williamson is a 55 y.o. female presenting for evaluation of:  Chief Complaint   Patient presents with   . MULTIPLE SCLEROSIS /DIFFICULTY WALKING   . HISTORY OF TBI/ SEIZURE   . Altered Mental Status   . RESTLESS LEGS/ SLEEP DIFFICULTY / FATIGUE      MULTIPLE SCLEROSIS/  Patient reports her legs are feeling heavy. Having difficulty feeling when she has to release her bladder. Also having difficulty producing bowel  movement.  She feels like her stomach is bloated. She also has "excessive gas." States she has not in over had a BM in 1 week. Tried stool softener with no relief.  She is having difficulty walking. She is not able to walk without her Rollator walker. She has trigeminal neuralgia pain as well. She feels like she could possibly have an abscess tooth, but has not seen dentist.  She is not currently on treatment for MS. She previously was taking 50,000 units of Vitamin D, she is not taking over the counter supplement.     HISTORY OF TBI/ SEIZURE  Patient states she has not had any seizures or seizure like activity. Takes Tegretol 100 mg mg three time a day. No side effects. Last seizure was several months ago.     RESTLESS LEGS/ SLEEP DIFFICULTY  Patient states her legs are heavy, weak and stiff. She also has cramping. Cramping starts in her toes and radiates to her thighs. She has started having difficulty walking. Her husband states "when she walks she looks like she has braces on her legs." She continues to fall and stay asleep due to pain. She is waking due to severe pain. She will to get  up and sit in her chair. She has swelling in her feet as well. She is requesting script for power wheel chair.       DATA SUMMARY:  12/04/2019  EXTENDED EEG  IMPRESSION:  This extended in-office continuously monitored video EEG in the awake and asleep states is within normal limits.    10/20/2019  ROUTINE EEG  IMPRESSION:  This routine EEG in the awake and drowsy states is within normal limits.    Brain MRI 11/11/2019  IMPRESSION:   Supratentorial white matter signal abnormalities, compatible with   multiple sclerosis.     No evidence of active demyelination.        09/01/19   Creatinine = 0.8  GFR = 81  Vitamin B12 = 648  A1c = 12.9   Drug screen within normal limits      VISIT SUMMARIES:  05/29/2020: Symptoms ongoing. Will send urgent referral to GI for constipation with significant pain. Discussed that if symptoms get worse, she  should go to the ER.  Start taking Lyrica 25 mg twice daily for 1 week, then increase to 50 mg twice daily.  Continue taking carbamazepine 100 mg three times daily.  Continue taking diazepam 5 mg twice daily as needed, refilled.  Continue taking Adderall 12.5 mg twice daily, refilled.  Will send a prescription for electric wheelchair and shower chair.    02/21/2020: Symptoms ongoing. Continue taking Adderall 12.5 mg twice daily, refilled.  Continue taing diazepam 5 mg twice daily as needed, refilled. Will send a referral to dietician to help manage diabetes.    11/20/2019: Symptoms ongoing. Continue Adderall 12.5 mg twice daily, refilled. Continue taking diazepam 5 mg twice daily as needed, refilled. We will order a 3 hour EEG to evaluate for seizures.     10/11/19: Patient with MS, history of TBI, seizure, restless legs, and sleep difficulty, new to me. Decrease Topamax by 50 mg every two weeks until discontinued. Continue Adderall 12.5 mg twice daily and   Valium 5 mg twice daily. Schedule brain MRI without contrast to evaluate for MS lesions. Send referral to [facility] to establish care. Send referral for home health assessment. Schedule routine EEG to evaluate for seizure-like activity.     MEDICATIONS  Outpatient Medications Marked as Taking for the 05/29/20 encounter (Telemedicine) with Alphonzo Lemmings, MD   Medication Sig   . albuterol 90 mcg/actuation inhaler Inhale 2 inhalations into the lungs   . amLODIPine (NORVASC) 5 MG tablet Take 5 mg by mouth once daily   . atenoloL (TENORMIN) 50 MG tablet Take 50 mg by mouth nightly   . baclofen (LIORESAL) 20 MG tablet Take 20 mg by mouth 2 (two) times daily   . DULoxetine (CYMBALTA) 30 MG DR capsule Take 30 mg by mouth 2 (two) times daily   . hydrOXYzine (ATARAX) 25 MG tablet Take 25 mg by mouth 3 (three) times daily as needed for Itching   . insulin lispro 100 unit/mL inph Inject subcutaneously 4-18 units three to four times daily   .  ipratropium-albuteroL (DUO-NEB) nebulizer solution Take 3 mLs by nebulization once daily as needed for Wheezing   . LANTUS SOLOSTAR U-100 INSULIN pen injector (concentration 100 units/mL) Inject subcutaneously nightly   . levothyroxine (SYNTHROID) 200 MCG tablet Take 200 mcg by mouth once daily Take on an empty stomach with a glass of water at least 30-60 minutes before breakfast.   . miscellaneous medical supply Misc Assessment for power wheelchair and shower chair   . [  DISCONTINUED] carBAMazepine (TEGRETOL) 200 mg tablet Take 100 mg (1/2 tab)  twice a day for a week then increaseto 100 mg three times a day and continue that dose   . [DISCONTINUED] dextroamphetamine-amphetamine (ADDERALL) 12.5 MG tablet Take 1 tablet (12.5 mg total) by mouth 2 (two) times daily   . [DISCONTINUED] diazePAM (VALIUM) 5 MG tablet Take 1 tablet by mouth twice daily as needed       ALLERGIES  Allergies   Allergen Reactions   . Dpt-Haemophilus Ps(Tet.Conj.) Rash   . Gabapentin Dizziness   . Latex, Natural Rubber Swelling   . Other Rash     SURGICAL STAPLES   . Tramadol Itching         EXAM     Vitals:    05/29/20 0934   PainSc:   9   PainLoc: Generalized     There is no height or weight on file to calculate BMI.     We were not able to do thorough physical exam during this televisit. Neurological exam is a crucial part of patient evaluation and lack of it can lead to misdiagnosis or missed diagnosis. If patient has concerns, they should consider making in person appointment with the provider. Provider should not be liable for consequences of lack of in person exam.    Unable to complete an exam today due to COVID-19 precautions. Exam below completed at last in office visit.     GENERAL:  Pleasant female, in no apparent distress today Normocephalic and atraumatic.    MUSCULOSKELETAL:  Bulk - Obese  Tone - Normal  Pronator Drift - Absent bilaterally.  Ambulation - Gait and station is severely unsteady with the physician, requires 1 assist  the entire time.  However, it is noted that patient does not come in with an assistive walking device such as cane or walker and is able to walk in and walk out of the visit without assistance.       PAST MEDICAL HISTORY  Past Medical History:   Diagnosis Date   . Carcinoid syndrome (CMS-HCC)    . COPD (chronic obstructive pulmonary disease) (CMS-HCC)    . Diabetes mellitus without complication (CMS-HCC)    . History of cancer    . Multiple sclerosis (CMS-HCC)    . Seizures (CMS-HCC)    . Von Willebrand disease (CMS-HCC)        PAST SURGICAL HISTORY  Past Surgical History:   Procedure Laterality Date   . APPENDECTOMY     . HYSTERECTOMY VAGINAL     . THYROIDECTOMY TOTAL     . TONSILLECTOMY Bilateral        FAMILY HISTORY  Family History   Problem Relation Age of Onset   . Multiple sclerosis Mother    . Cancer Mother    . Parkinsonism Mother    . High blood pressure (Hypertension) Mother    . Depression Mother    . Alzheimer's disease Father    . Cancer Father    . Diabetes Father    . Diabetes Daughter    . Diabetes Maternal Grandmother        SOCIAL HISTORY   Social History     Tobacco Use   . Smoking status: Current Every Day Smoker     Types: Cigarettes   . Smokeless tobacco: Never Used   Substance Use Topics   . Alcohol use: Not Currently         REVIEW OF SYSTEMS:   13 system ROS form  was given to the patient to complete and I have reviewed it.  The form was sent for scan to the patient's EHR.  Pertinent positives and negatives are mentioned above in the HPI and all other systems are negative.      DATA   I have personally reviewed all of the data outlined below both prior to the appointment and during the appointment with the patient as appropriate.    No visits with results within 6 Month(s) from this visit.   Latest known visit with results is:   No results found for any previous visit.     11/11/2019  BRAIN MRI W AND WO     EXAM:   MRI HEAD WITHOUT CONTRAST     TECHNIQUE:   Multiplanar, multiecho pulse  sequences of the brain and surrounding   structures were obtained without intravenous contrast.     COMPARISON: None.     FINDINGS:   Brain: No diffusion-weighted signal abnormality. No intracranial   hemorrhage. Scattered T2/FLAIR hyperintense foci involving the   periventricular and juxtacortical white matter.     Cerebral volume is within normal limits. Normal appearance of the   corpus callosum. No midline shift, ventriculomegaly or extra-axial   fluid collection. No mass lesion.     Vascular: Normal flow voids. Right occipital developmental venous   anomaly.     Skull and upper cervical spine: Normal marrow signal.     Sinuses/Orbits: Normal orbits. Mild ethmoid sinus mucosal   thickening. No mastoid effusion.     Other: None.     IMPRESSION:   Supratentorial white matter signal abnormalities, compatible with   multiple sclerosis.     No evidence of active demyelination.       Electronically Signed   By: Stana Bunting M.D.   On: 11/11/2019 20:24        No follow-ups on file.    Payor: BCBS Scientist, physiological / Plan: [place] [employer] BL[employer] / Product Type: Medicaid /       This video encounter was conducted with the patient's (or proxy's) verbal consent via secure, interactive audio and video telecommunications.      Patient was instructed to have this encounter in a suitably private space; and to only have persons present to whom they give permission to participate.  In addition, patient identity was confirmed by use of two identifiers (name and date of birth).     This note is partially prepared by Lily Peer, Scribe, in the presence of and acting as the scribe for Dr. Theora Master.     I have reviewed, edited and added to the note as needed to reflect my best personal medical judgment.        Dr. Theora Master, MD  [facility]  [facility]  [place], [place]  Ph:  [phone]  Fax:  [phone]    During this declared National Disaster and Covid-19 pandemic, the patient has requested to  obtain treatment via telehealth. Both patient and care provider will, using this treatment method, endeavor to provide care and treatment that cannot be done in office.  Under these emergent conditions it is the best (or only) means of connecting. The patient has been advised of the potential risks and limitations of this mode of treatment (including, but not limited to, the absence of in-person examination) and has agreed to be treated in a remote fashion in spite of them.  Any and all of the patient's/patient's family's  questions on this issue have been answered, and I have made no promises or guarantees to the patient.  The patient has also been advised to contact this office for worsening conditions or problems, and seek medical treatment and/or call 911 if the patient deems either necessary.    This video encounter was conducted with the patient's (or proxy's) verbal consent via secure, interactive audio and video telecommunications while in clinic/office/hospital.    The patient (or proxy) was instructed to have this encounter in a suitably private space and to only have persons present to whom they give permission to participate. In addition, patient identity was confirmed by use of name plus an additional identifier.      This visit was coded based on medical decision making (MDM).

## 2020-06-13 ENCOUNTER — Other Ambulatory Visit (HOSPITAL_COMMUNITY): Payer: Self-pay | Admitting: Neurology

## 2020-06-13 ENCOUNTER — Other Ambulatory Visit: Payer: Self-pay | Admitting: Neurology

## 2020-06-13 DIAGNOSIS — G35 Multiple sclerosis: Secondary | ICD-10-CM

## 2020-06-19 ENCOUNTER — Other Ambulatory Visit: Payer: Self-pay | Admitting: Gastroenterology

## 2020-06-19 DIAGNOSIS — R194 Change in bowel habit: Secondary | ICD-10-CM

## 2020-06-19 DIAGNOSIS — R1031 Right lower quadrant pain: Secondary | ICD-10-CM

## 2020-06-19 DIAGNOSIS — R140 Abdominal distension (gaseous): Secondary | ICD-10-CM

## 2020-06-19 DIAGNOSIS — K5904 Chronic idiopathic constipation: Secondary | ICD-10-CM

## 2020-06-20 ENCOUNTER — Telehealth (HOSPITAL_COMMUNITY): Payer: Medicaid Other

## 2020-06-23 ENCOUNTER — Ambulatory Visit: Payer: Medicaid Other

## 2020-06-26 ENCOUNTER — Ambulatory Visit: Payer: Medicaid Other

## 2020-07-09 ENCOUNTER — Ambulatory Visit
Admission: RE | Admit: 2020-07-09 | Discharge: 2020-07-09 | Disposition: A | Discharge status: Home / Self Care | Payer: Medicaid Other | Source: Ambulatory Visit | Attending: Gastroenterology | Admitting: Gastroenterology

## 2020-07-09 ENCOUNTER — Other Ambulatory Visit: Payer: Self-pay

## 2020-07-09 DIAGNOSIS — K5904 Chronic idiopathic constipation: Secondary | ICD-10-CM | POA: Insufficient documentation

## 2020-07-09 DIAGNOSIS — R140 Abdominal distension (gaseous): Secondary | ICD-10-CM | POA: Insufficient documentation

## 2020-07-09 DIAGNOSIS — R1031 Right lower quadrant pain: Secondary | ICD-10-CM | POA: Insufficient documentation

## 2020-07-09 DIAGNOSIS — R194 Change in bowel habit: Secondary | ICD-10-CM | POA: Diagnosis present

## 2020-07-09 LAB — POCT I-STAT CREATININE: Creatinine, Ser: 0.6 mg/dL (ref 0.44–1.00)

## 2020-07-09 MED ORDER — IOHEXOL 300 MG/ML  SOLN
100.0000 mL | Freq: Once | INTRAMUSCULAR | Status: AC | PRN
  Administered 2020-07-09: 100 mL via INTRAVENOUS

## 2020-09-03 ENCOUNTER — Other Ambulatory Visit: Payer: Self-pay | Admitting: Nurse Practitioner

## 2020-09-03 NOTE — Telephone Encounter (Signed)
Requested medication (s) are due for refill today: yes  Requested medication (s) are on the active medication list: yes   Last refill:  06/05/2020  Future visit scheduled: no  Notes to clinic:  overdue for follow up and labs Message sent for patient to contact office    Requested Prescriptions  Pending Prescriptions Disp Refills   levothyroxine (SYNTHROID) 200 MCG tablet [Pharmacy Med Name: Levothyroxine Sodium 200 MCG Oral Tablet] 90 tablet 0    Sig: TAKE 1 TABLET BY MOUTH ONCE DAILY WITH  25MCG      Endocrinology:  Hypothyroid Agents Failed - 09/03/2020 10:15 AM      Failed - TSH needs to be rechecked within 3 months after an abnormal result. Refill until TSH is due.      Failed - TSH in normal range and within 360 days    TSH  Date Value Ref Range Status  04/03/2020 0.401 (L) 0.450 - 4.500 uIU/mL Final          Failed - Valid encounter within last 12 months    Recent Outpatient Visits           12 months ago Depression, recurrent ([facility])   [place], Barbaraann Faster, NP   1 year ago Encounter to establish care   [facility], NP

## 2020-09-03 NOTE — Telephone Encounter (Signed)
Lvm to make this apt. 

## 2020-09-05 ENCOUNTER — Other Ambulatory Visit: Payer: Self-pay | Admitting: Nurse Practitioner

## 2020-09-05 MED ORDER — LEVOTHYROXINE SODIUM 200 MCG PO TABS: 200.0000 ug | ORAL_TABLET | Freq: Every day | ORAL | 0 refills | Status: DC

## 2020-09-05 NOTE — Telephone Encounter (Signed)
Copied from [place] [id number]. Topic: Quick Communication - Rx Refill/Question >> Sep 05, 2020  3:14 PM Lenon Curt, Everette A wrote: Medication: levothyroxine (SYNTHROID) 200 MCG tablet  - patient has 0 (zero) tablets remaining  Has the patient contacted their pharmacy? Yes.   (Agent: If no, request that the patient contact the pharmacy for the refill.) (Agent: If yes, when and what did the pharmacy advise?)  Preferred Pharmacy (with phone number or street name): [facility], [place]  Phone:  [phone] Fax:  [phone]  Agent: Please be advised that RX refills may take up to 3 business days. We ask that you follow-up with your pharmacy.

## 2020-09-09 ENCOUNTER — Other Ambulatory Visit: Payer: Self-pay | Admitting: Nurse Practitioner

## 2020-09-09 NOTE — Telephone Encounter (Signed)
   Notes to clinic:  The original prescription was discontinued on 03/27/2020 by Venita Lick, NP for the following reason: Discontinued by provider. Renewing this prescription may not be appropriate.   Requested Prescriptions  Pending Prescriptions Disp Refills   hydrOXYzine (ATARAX/VISTARIL) 25 MG tablet [Pharmacy Med Name: hydrOXYzine HCl 25 MG Oral Tablet] 90 tablet 0    Sig: TAKE 1 TABLET BY MOUTH THREE TIMES DAILY      Ear, Nose, and Throat:  Antihistamines Failed - 09/09/2020 12:58 PM      Failed - Valid encounter within last 12 months    Recent Outpatient Visits           1 year ago Depression, recurrent ([place])   [place], Barbaraann Faster, NP   1 year ago Encounter to establish care   [facility] [place], Barbaraann Faster, NP       Future Appointments             In 1 week McElwee, Scheryl Darter, NP [place]

## 2020-09-10 NOTE — Telephone Encounter (Signed)
Patient has appt on 09/16/20

## 2020-09-10 NOTE — Telephone Encounter (Signed)
FYI

## 2020-09-10 NOTE — Telephone Encounter (Signed)
PT HAS APT ON 09/16/2020

## 2020-09-16 ENCOUNTER — Other Ambulatory Visit: Payer: Self-pay

## 2020-09-16 ENCOUNTER — Ambulatory Visit: Payer: Medicaid Other | Admitting: Nurse Practitioner

## 2020-09-16 ENCOUNTER — Encounter: Payer: Self-pay | Admitting: Nurse Practitioner

## 2020-09-16 ENCOUNTER — Other Ambulatory Visit: Payer: Self-pay | Admitting: Nurse Practitioner

## 2020-09-16 VITALS — BP 132/78 | HR 80 | Temp 98.3°F | Wt 208.0 lb

## 2020-09-16 DIAGNOSIS — G35 Multiple sclerosis: Secondary | ICD-10-CM

## 2020-09-16 DIAGNOSIS — J449 Chronic obstructive pulmonary disease, unspecified: Secondary | ICD-10-CM

## 2020-09-16 DIAGNOSIS — I700 Atherosclerosis of aorta: Secondary | ICD-10-CM

## 2020-09-16 DIAGNOSIS — E1159 Type 2 diabetes mellitus with other circulatory complications: Secondary | ICD-10-CM | POA: Diagnosis not present

## 2020-09-16 DIAGNOSIS — Z1231 Encounter for screening mammogram for malignant neoplasm of breast: Secondary | ICD-10-CM

## 2020-09-16 DIAGNOSIS — E1069 Type 1 diabetes mellitus with other specified complication: Secondary | ICD-10-CM

## 2020-09-16 DIAGNOSIS — I152 Hypertension secondary to endocrine disorders: Secondary | ICD-10-CM

## 2020-09-16 DIAGNOSIS — E890 Postprocedural hypothyroidism: Secondary | ICD-10-CM | POA: Diagnosis not present

## 2020-09-16 DIAGNOSIS — L729 Follicular cyst of the skin and subcutaneous tissue, unspecified: Secondary | ICD-10-CM

## 2020-09-16 DIAGNOSIS — E669 Obesity, unspecified: Secondary | ICD-10-CM

## 2020-09-16 DIAGNOSIS — F17219 Nicotine dependence, cigarettes, with unspecified nicotine-induced disorders: Secondary | ICD-10-CM

## 2020-09-16 DIAGNOSIS — F339 Major depressive disorder, recurrent, unspecified: Secondary | ICD-10-CM

## 2020-09-16 LAB — MICROALBUMIN, URINE WAIVED
Creatinine, Urine Waived: 200 mg/dL (ref 10–300)
Microalb, Ur Waived: 80 mg/L — ABNORMAL HIGH (ref 0–19)

## 2020-09-16 LAB — BAYER DCA HB A1C WAIVED: HB A1C (BAYER DCA - WAIVED): 10 % — ABNORMAL HIGH (ref <7.0)

## 2020-09-16 MED ORDER — AMLODIPINE BESYLATE 5 MG PO TABS: 5.0000 mg | ORAL_TABLET | Freq: Every day | ORAL | 4 refills | Status: AC

## 2020-09-16 MED ORDER — INSULIN LISPRO (1 UNIT DIAL) 100 UNIT/ML (KWIKPEN): PEN_INJECTOR | SUBCUTANEOUS | 11 refills | Status: AC

## 2020-09-16 MED ORDER — HYDROXYZINE HCL 25 MG PO TABS: 25.0000 mg | ORAL_TABLET | Freq: Three times a day (TID) | ORAL | 0 refills | Status: DC

## 2020-09-16 MED ORDER — ATENOLOL 50 MG PO TABS: 50.0000 mg | ORAL_TABLET | Freq: Every day | ORAL | 4 refills | Status: AC

## 2020-09-16 MED ORDER — INSULIN GLARGINE 100 UNIT/ML SOLOSTAR PEN: 50.0000 [IU] | PEN_INJECTOR | Freq: Every day | SUBCUTANEOUS | 0 refills | Status: AC

## 2020-09-16 MED ORDER — PEN NEEDLES 32G X 4 MM MISC: 12 refills | Status: AC

## 2020-09-16 MED ORDER — EZETIMIBE 10 MG PO TABS: 10.0000 mg | ORAL_TABLET | Freq: Every day | ORAL | 0 refills | Status: DC

## 2020-09-16 NOTE — Assessment & Plan Note (Signed)
Chronic, stable. PHQ 9 score is 4 today. Symptoms controlled well with cymbalta. Continue current regimen. Follow-up in 6 months.

## 2020-09-16 NOTE — Assessment & Plan Note (Addendum)
Chronic, ongoing. A1C down from 12.9% to 10% today. This is still elevated and not at goal. She had a referral placed to endocrine last visit and she states she needs to make an appointment with Dr. Rayburn Go. She recently increased her lantus from 42 units to between 48 and 50 units. Will not make any changes today and see how her sugars are running next visit. Focus on carb modified diet. Encouraged her to bring sugar log to next visit. Urine microalbumin elevated. Will consider changing and/or adding ace/arb to regimen next visit. 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 Continue cymbalta and lyrica. Refills sent to the pharmacy. Follow-up in 3 months.

## 2020-09-16 NOTE — Assessment & Plan Note (Signed)
Encouraged complete tobacco cessation. Not interested at this time.

## 2020-09-16 NOTE — Assessment & Plan Note (Addendum)
Chronic, stable. Blood pressure 132/78 in office today. Continue current regimen. Will check CMP, CBC today. Follow-up in 6 months. Refills sent to the pharmacy

## 2020-09-16 NOTE — Assessment & Plan Note (Signed)
Chronic, ongoing. Currently taking levothyroxine 274mg daily. Will check TSH today and adjust regimen as needed.

## 2020-09-16 NOTE — Progress Notes (Addendum)
Established Patient Office Visit  Subjective:  Patient ID: Margaret Church, female    DOB: [birth year]  Age: 55 y.o. MRN: [id number]  CC:  Chief Complaint  Patient presents with   Depression   Multiple Sclerosis   Numbness    Patient states for the past 3 months she has noticed numbness and pain in the right side of her back through the front of her chest, right side. Patient states she has an apt with neurology this month.    HPI Margaret Church presents for follow up on depression, hypertension, COPD, and diabetes.   HYPERTENSION  Hypertension status: controlled  Satisfied with current treatment? yes Duration of hypertension: chronic BP monitoring frequency:  daily BP range: "good" BP medication side effects:  no Medication compliance: excellent compliance Previous BP meds:amlodipine, atenolol Aspirin: no Recurrent headaches: no Visual changes: yes--has MS, follows with opthalmology Palpitations: no Dyspnea: yes, intermittent, albuterol helps Chest pain: no Lower extremity edema: no Dizzy/lightheaded: no  DIABETES  Hypoglycemic episodes:occasional, fluctuates between highs and lows Polydipsia/polyuria: no Visual disturbance: yes--chronic, with MS Chest pain: no Paresthesias: yes Glucose Monitoring: yes  Accucheck frequency: TID  Fasting glucose:  Post prandial:  Evening:  Before meals: Taking Insulin?: yes  Long acting insulin: Lantus 48-50 units qHS  Short acting insulin: 4-14 units with meals Blood Pressure Monitoring: daily Foot Exam: Not up to Date Diabetic Education: Completed Pneumovax: Not up to Date Influenza: Not up to Date Aspirin: no  Depression screen [facility] 2/9 09/16/2020 09/01/2019  Decreased Interest 1 0  Down, Depressed, Hopeless 0 0  PHQ - 2 Score 1 0  Altered sleeping 1 1  Tired, decreased energy 2 1  Change in appetite 0 0  Feeling bad or failure about yourself  0 0  Trouble concentrating 0 2  Moving slowly or fidgety/restless 0  0  Suicidal thoughts 0 0  PHQ-9 Score 4 4  Difficult doing work/chores Not difficult at all Not difficult at all     Past Medical History:  Diagnosis Date   Anxiety    Arthritis    Asthma    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)    Colon cancer ([place]) 2009   COPD (chronic obstructive pulmonary disease) ([place])    Depression    Diabetes mellitus without complication ([place])    Family history of leukemia    Family history of lymphoma    Family history of skin cancer    Family history of thyroid cancer    GERD (gastroesophageal reflux disease)    History of esophageal cancer    Hyperlipidemia    Hypertension    Neuromuscular disorder ([place])    Oxygen deficiency    Seizures (HCC)    Sleep apnea    Thyroid cancer ([place]) 2014   Thyroid disease    Von Willebrand disease, type I ([place])     Past Surgical History:  Procedure Laterality Date   ABDOMINAL HYSTERECTOMY     APPENDECTOMY     COLON SURGERY     COLONOSCOPY WITH PROPOFOL N/A 12/21/2019   Procedure: COLONOSCOPY WITH PROPOFOL;  Surgeon: Lin Landsman, MD;  Location: [facility] ENDOSCOPY;  Service: Gastroenterology;  Laterality: N/A;   ESOPHAGOGASTRODUODENOSCOPY (EGD) WITH PROPOFOL N/A 12/21/2019   Procedure: ESOPHAGOGASTRODUODENOSCOPY (EGD) WITH PROPOFOL;  Surgeon: Lin Landsman, MD;  Location: [facility] END[facility]COPY;  Service: Gastroenterology;  Laterality: N/A;   HERNIA REPAIR     KNEE SURGERY     tonsils surgery  Family History  Problem Relation Age of Onset   Lung cancer Mother 6   Cancer Mother        skin cancer;    Heart disease Mother    Alzheimer's disease Father    Cancer Father        skin cancer   Hypertension Father    Hip fracture Brother    Hypertension Brother    Skin cancer Brother    Alcohol abuse Brother    Hypertension Brother    Other Brother        large tumor on thigh removed in [place]   Colon polyps Daughter 28   Skin cancer Maternal Aunt    Skin cancer Maternal Aunt     Von Willebrand disease Paternal Aunt        and carcinoid disease   Esophageal cancer Paternal Aunt    Multiple myeloma Paternal Uncle    Cancer Paternal Uncle        unk type   Liver cancer Paternal Uncle    Lymphoma Paternal Grandmother    Thyroid cancer Other     Social History   Socioeconomic History   Marital status: Significant Other    Spouse name: Not on file   Number of children: Not on file   Years of education: Not on file   Highest education level: Not on file  Occupational History   Not on file  Tobacco Use   Smoking status: Every Day    Packs/day: 0.25    Years: 8.00    Pack years: 2.00    Types: Cigarettes   Smokeless tobacco: Never   Tobacco comments:    4 Cigs daily--12/20/2019  Vaping Use   Vaping Use: Never used  Substance and Sexual Activity   Alcohol use: Never   Drug use: Not Currently    Types: Marijuana    Comment: medical marijuana card   Sexual activity: Yes  Other Topics Concern   Not on file  Social History Narrative   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 [place]oved [place] closer to [place] ~ [place] ago [place] Social smoker 4-5 cig/ day; no alcohol; hx of marijuana/ palliative care [[place]]   Social Determinants of Health   Financial Resource Strain: Not on file  Food Insecurity: Not on file  Transportation Needs: Not on file  Physical Activity: Not on file  Stress: Not on file  Social Connections: Not on file  Intimate Partner Violence: Not on file    Outpatient Medications Prior to Visit  Medication Sig Dispense Refill   ACCU-CHEK GUIDE test strip USE TO TEST BLOOD SUGAR FOUR TIMES DAILY 200 each 0   Accu-Chek Softclix Lancets lancets USE TO TEST BLOOD SUGAR FOUR TIMES DAILY 100 each 0   albuterol (PROVENTIL) (2.5 MG/3ML) 0.083% nebulizer solution Take 3 mLs (2.5 mg total) by nebulization every 6 (six) hours as needed for wheezing or shortness of breath. 75 mL 12    albuterol (VENTOLIN HFA) 108 (90 Base) MCG/ACT inhaler Inhale 2 puffs into the lungs every 6 (six) hours as needed for wheezing or shortness of breath. 18 g 4   AMITIZA 8 MCG capsule Take 8 mcg by mouth 2 (two) times daily.     amphetamine-dextroamphetamine (ADDERALL) 12.5 MG tablet Take 12.5 mg by mouth 2 (two) times daily.     Blood Glucose Monitoring Suppl (ACCU-CHEK GUIDE ME) w/Device KIT USE TO TEST BLOOD SUGAR FOUR TIMES  DAILY 1 kit 0   carbamazepine (TEGRETOL) 200 MG tablet Take 100 mg by mouth 3 (three) times daily.     diazepam (VALIUM) 5 MG tablet Take 5 mg by mouth every 6 (six) hours as needed for anxiety.     DULCOLAX 10 MG suppository Place 10 mg rectally daily as needed.     DULoxetine (CYMBALTA) 30 MG capsule Take 1 capsule (30 mg total) by mouth 2 (two) times daily. 180 capsule 4   Fluticasone-Salmeterol (ADVAIR) 250-50 MCG/DOSE AEPB Inhale 1 puff into the lungs daily. 60 each 4   levothyroxine (SYNTHROID) 200 MCG tablet Take 1 tablet (200 mcg total) by mouth daily. With 25 mcg. (total 225 mcg daily) 30 tablet 0   polyethylene glycol powder (GLYCOLAX/MIRALAX) 17 GM/SCOOP powder Take 255 g by mouth daily. 255 g 3   pregabalin (LYRICA) 25 MG capsule Take by mouth.     Tiotropium Bromide Monohydrate (SPIRIVA RESPIMAT) 2.5 MCG/ACT AERS Inhale 2 puffs into the lungs daily. 4 g 11   amLODipine (NORVASC) 5 MG tablet Take 1 tablet (5 mg total) by mouth daily. 90 tablet 4   atenolol (TENORMIN) 50 MG tablet Take 1 tablet (50 mg total) by mouth daily. 90 tablet 4   baclofen (LIORESAL) 20 MG tablet Take 1 tablet (20 mg total) by mouth 2 (two) times daily. 180 each 4   hydrOXYzine (ATARAX/VISTARIL) 25 MG tablet Take 25 mg by mouth 3 (three) times daily.     insulin glargine (LANTUS) 100 UNIT/ML Solostar Pen Inject 42 Units into the skin at bedtime. (Patient taking differently: Inject 48 Units into the skin at bedtime. 48-50 units at bedtime) 15 mL 11   insulin lispro (HUMALOG KWIKPEN) 100  UNIT/ML KwikPen Inject 4 to 18 units subcutaneously up to 3-4 times a day. 15 mL 11   tiZANidine (ZANAFLEX) 4 MG tablet Take by mouth.     amoxicillin (AMOXIL) 500 MG capsule Take 1 capsule (500 mg total) by mouth 3 (three) times daily. 21 capsule 0   levothyroxine (SYNTHROID) 200 MCG tablet TAKE 1 TABLET BY MOUTH ONCE DAILY WITH  25MCG (Patient not taking: Reported on 09/16/2020) 30 tablet 0   levothyroxine (SYNTHROID) 25 MCG tablet Take 25 mcg by mouth daily before breakfast. Take in addition to the 200 mg (Patient not taking: Reported on 09/16/2020)     Tiotropium Bromide Monohydrate (SPIRIVA RESPIMAT) 2.5 MCG/ACT AERS Inhale 2 puffs into the lungs daily. (Patient not taking: Reported on 09/16/2020) 8 g 0   No facility-administered medications prior to visit.    Allergies  Allergen Reactions   Latex     Other reaction(s): Hive   Tetanus Antitoxin     Other reaction(s): Arm Swelling, Fever, N/V   Gabapentin Rash    Other reaction(s): Pins and Needles Sensation on head   Other Other (See Comments), Rash and Swelling    SURGICAL STAPLES CAUSE LOCAL SWELLING   Tramadol Rash    ROS Review of Systems  Constitutional:  Positive for fatigue. Negative for fever.  HENT: Negative.    Eyes:  Positive for visual disturbance.  Respiratory:  Positive for shortness of breath (intermittent).   Cardiovascular: Negative.   Gastrointestinal: Negative.   Genitourinary: Negative.   Musculoskeletal:  Positive for back pain (numbness and pain to right upper back).  Skin:        Cyst to right upper back, chronic  Neurological:  Positive for headaches (sometimes).  Psychiatric/Behavioral: Negative.       Objective:  Physical Exam Vitals and nursing note reviewed.  Constitutional:      General: She is not in acute distress.    Appearance: Normal appearance.  HENT:     Head: Normocephalic and atraumatic.  Eyes:     Conjunctiva/sclera: Conjunctivae normal.  Neck:     Vascular: No carotid bruit.   Cardiovascular:     Rate and Rhythm: Normal rate and regular rhythm.     Pulses: Normal pulses.     Heart sounds: Normal heart sounds.  Pulmonary:     Effort: Pulmonary effort is normal.     Breath sounds: Normal breath sounds.  Abdominal:     Palpations: Abdomen is soft.     Tenderness: There is no abdominal tenderness.  Musculoskeletal:        General: Normal range of motion.     Cervical back: Normal range of motion.     Right lower leg: No edema.     Left lower leg: No edema.     Comments: Tenderness to palpation of right medial back  Skin:    General: Skin is warm and dry.     Comments: Slightly enlarged cyst to right upper back, no erythema  Neurological:     General: No focal deficit present.     Mental Status: She is alert and oriented to person, place, and time.  Psychiatric:        Mood and Affect: Mood normal.        Behavior: Behavior normal.        Thought Content: Thought content normal.        Judgment: Judgment normal.   Diabetic Foot Exam - Simple   Simple Foot Form Visual Inspection No deformities, no ulcerations, no other skin breakdown bilaterally: Yes Sensation Testing See comments: Yes Pulse Check Posterior Tibialis and Dorsalis pulse intact bilaterally: Yes Comments Multiple areas on bottom of both feet not able to feel monofilament test      BP 132/78 (BP Location: Left Arm, Patient Position: Sitting)   Pulse 80   Temp 98.3 F (36.8 C)   Wt 208 lb (94.3 kg)   SpO2 94%   BMI 34.61 kg/m  Wt Readings from Last 3 Encounters:  09/16/20 208 lb (94.3 kg)  12/21/19 208 lb (94.3 kg)  12/20/19 208 lb (94.3 kg)     Health Maintenance Due  Topic Date Due   PNEUMOCOCCAL POLYSACCHARIDE VACCINE AGE 12-64 HIGH RISK  Never done   COVID-19 Vaccine (1) Never done   Pneumococcal Vaccine 75-29 Years old (1 - PCV) Never done   FOOT EXAM  Never done   OPHTHALMOLOGY EXAM  Never done   HIV Screening  Never done   Hepatitis C Screening  Never done    TETANUS/TDAP  Never done   Zoster Vaccines- Shingrix (1 of 2) Never done   PAP SMEAR-Modifier  Never done   MAMMOGRAM  Never done   INFLUENZA VACCINE  09/16/2020    There are no preventive care reminders to display for this patient.  Lab Results  Component Value Date   TSH 0.960 09/16/2020   Lab Results  Component Value Date   WBC 10.6 09/16/2020   HGB 14.8 09/16/2020   HCT 45.4 09/16/2020   MCV 89 09/16/2020   PLT 309 09/16/2020   Lab Results  Component Value Date   NA 138 09/16/2020   K 5.0 09/16/2020   CO2 26 09/16/2020   GLUCOSE 251 (H) 09/16/2020   BUN 18 09/16/2020  CREATININE 0.62 09/16/2020   BILITOT <0.2 09/16/2020   ALKPHOS 145 (H) 09/16/2020   AST 15 09/16/2020   ALT 21 09/16/2020   PROT 7.1 09/16/2020   ALBUMIN 4.1 09/16/2020   CALCIUM 9.5 09/16/2020   ANIONGAP 11 04/03/2020   EGFR 104 09/16/2020   Lab Results  Component Value Date   CHOL 297 (H) 09/16/2020   Lab Results  Component Value Date   HDL 37 (L) 09/16/2020   Lab Results  Component Value Date   LDLCALC 170 (H) 09/16/2020   Lab Results  Component Value Date   TRIG 454 (H) 09/16/2020   No results found for: CHOLHDL Lab Results  Component Value Date   HGBA1C 10.0 (H) 09/16/2020      Assessment & Plan:   Problem List Items Addressed This Visit       Cardiovascular and Mediastinum   Hypertension associated with diabetes ([place]) - Primary    Chronic, stable. Blood pressure 132/78 in office today. Continue current regimen. Will check CMP, CBC today. Follow-up in 6 months. Refills sent to the pharmacy       Relevant Medications   amLODipine (NORVASC) 5 MG tablet   atenolol (TENORMIN) 50 MG tablet   insulin glargine (LANTUS) 100 UNIT/ML Solostar Pen   insulin lispro (HUMALOG KWIKPEN) 100 UNIT/ML KwikPen   ezetimibe (ZETIA) 10 MG tablet   Other Relevant Orders   CBC with Differential/Platelet (Completed)   Comprehensive metabolic panel (Completed)   Aortic atherosclerosis  (HCC)    Noted on CT scan 07/09/20. She was taking lipitor in the past, however stopped taking it when she read an article about statins leading to dementia. Her father and multiple family members had dementia and she did not want to increase her chances of having it. She is interested in taking any other medication for cholesterol as she knows she is at increased risk for heart disease. Will start zetia daily. Check lipid panel today. Follow-up in 3 months.        Relevant Medications   amLODipine (NORVASC) 5 MG tablet   atenolol (TENORMIN) 50 MG tablet   ezetimibe (ZETIA) 10 MG tablet   Other Relevant Orders   Lipid Panel w/o Chol/HDL Ratio (Completed)     Respiratory   Chronic obstructive pulmonary disease (HCC)    Chronic, ongoing. Continue inhaler regimen. Will check spirometry next visit in 3 months. She had CT abdomen 07/09/20 which showed multiple lung nodules. She has an appointment this month to follow-up with her oncologist. If needed, will place referral to pulmonology for follow-up. Recommend complete tobacco cessation.          Endocrine   Type 1 diabetes mellitus with obesity (HCC)    Chronic, ongoing. A1C down from 12.9% to 10% today. This is still elevated and not at goal. She had a referral placed to endocrine last visit and she states she needs to make an appointment with Dr. Rayburn Go. She recently increased her lantus from 42 units to between 48 and 50 units. Will not make any changes today and see how her sugars are running next visit. Focus on carb modified diet. Encouraged her to bring sugar log to next visit. Urine microalbumin elevated. Will consider changing and/or adding ace/arb to regimen next visit. 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 Continue cymbalta and lyrica. Refills sent to the pharmacy. Follow-up in 3 months.        Relevant Medications   insulin glargine (LANTUS) 100 UNIT/ML Solostar Pen   insulin lispro (HUMALOG  KWIKPEN) 100 UNIT/ML KwikPen   Other Relevant  Orders   Bayer DCA Hb A1c Waived (Completed)   CBC with Differential/Platelet (Completed)   Comprehensive metabolic panel (Completed)   Lipid Panel w/o Chol/HDL Ratio (Completed)   Microalbumin, Urine Waived (Completed)   Postoperative hypothyroidism    Chronic, ongoing. Currently taking levothyroxine 247mg daily. Will check TSH today and adjust regimen as needed.        Relevant Medications   atenolol (TENORMIN) 50 MG tablet   Other Relevant Orders   TSH (Completed)     Nervous and Auditory   Multiple sclerosis (HCC)    Chronic, ongoing. Follows with Dr. PMelrose Nakayamawith neurology. She has an appointment with him to discuss her right sided pain and numbness as she is concerned it is part of her MS. Continue collaboration and follow-ups with neurology.        Nicotine dependence, cigarettes, w unsp disorders    Encouraged complete tobacco cessation. Not interested at this time.          Other   Depression, recurrent (HCC)    Chronic, stable. PHQ 9 score is 4 today. Symptoms controlled well with cymbalta. Continue current regimen. Follow-up in 6 months.        Relevant Medications   hydrOXYzine (ATARAX/VISTARIL) 25 MG tablet   Other Visit Diagnoses     Skin cyst       Has had multiple I&Ds of cyst on her back. Would like referral to be placed for removal of cyst.    Relevant Orders   Ambulatory referral to General Surgery   Encounter for screening mammogram for malignant neoplasm of breast       Ordre for mammogram placed   Relevant Orders   MM 3D SCREEN BREAST BILATERAL       Meds ordered this encounter  Medications   amLODipine (NORVASC) 5 MG tablet    Sig: Take 1 tablet (5 mg total) by mouth daily.    Dispense:  90 tablet    Refill:  4   atenolol (TENORMIN) 50 MG tablet    Sig: Take 1 tablet (50 mg total) by mouth daily.    Dispense:  90 tablet    Refill:  4   hydrOXYzine (ATARAX/VISTARIL) 25 MG tablet    Sig: Take 1 tablet (25 mg total) by mouth 3 (three)  times daily.    Dispense:  30 tablet    Refill:  0   insulin glargine (LANTUS) 100 UNIT/ML Solostar Pen    Sig: Inject 50 Units into the skin at bedtime. 48-50 units at bedtime    Dispense:  45 mL    Refill:  0   insulin lispro (HUMALOG KWIKPEN) 100 UNIT/ML KwikPen    Sig: Inject 4 to 18 units subcutaneously up to 3-4 times a day.    Dispense:  15 mL    Refill:  11   ezetimibe (ZETIA) 10 MG tablet    Sig: Take 1 tablet (10 mg total) by mouth daily.    Dispense:  90 tablet    Refill:  0   Insulin Pen Needle (PEN NEEDLES) 32G X 4 MM MISC    Sig: Use for each dose of lantus and humalog injections    Dispense:  100 each    Refill:  12     Follow-up: Return in about 3 months (around 12/17/2020) for dm with Fani Rotondo (40 minutes please) with spirometry.    LCharyl Dancer NP

## 2020-09-16 NOTE — Assessment & Plan Note (Signed)
Chronic, ongoing. Follows with Dr. Melrose Nakayama with neurology. She has an appointment with him to discuss her right sided pain and numbness as she is concerned it is part of her MS. Continue collaboration and follow-ups with neurology.

## 2020-09-16 NOTE — Telephone Encounter (Signed)
Requested Prescriptions  Pending Prescriptions Disp Refills  . hydrOXYzine (ATARAX/VISTARIL) 25 MG tablet [Pharmacy Med Name: hydrOXYzine HCl 25 MG Oral Tablet] 90 tablet 0    Sig: TAKE 1 TABLET BY MOUTH THREE TIMES DAILY     Ear, Nose, and Throat:  Antihistamines Passed - 09/16/2020  1:25 PM      Passed - Valid encounter within last 12 months    Recent Outpatient Visits          Today Hypertension associated with diabetes ([place])   [place] McElwee, Lauren A, NP   1 year ago Depression, recurrent ([place])   [place], Barbaraann Faster, NP   1 year ago Encounter to establish care   [facility] [place], Henrine Screws T, NP      Future Appointments            In 3 months Cannady, Barbaraann Faster, NP [facility]           . tiZANidine (ZANAFLEX) 4 MG tablet [Pharmacy Med Name: tiZANidine HCl 4 MG Oral Tablet] 90 tablet 0    Sig: Take 1 tablet by mouth three times daily as needed     Not Delegated - Cardiovascular:  Alpha-2 Agonists - tizanidine Failed - 09/16/2020  1:25 PM      Failed - This refill cannot be delegated      Passed - Valid encounter within last 6 months    Recent Outpatient Visits          Today Hypertension associated with diabetes ([place])   [place] McElwee, Lauren A, NP   1 year ago Depression, recurrent ([place])   [place], Henrine Screws T, NP   1 year ago Encounter to establish care   [place], Barbaraann Faster, NP      Future Appointments            In 3 months Cannady, Barbaraann Faster, NP [employer]

## 2020-09-16 NOTE — Assessment & Plan Note (Signed)
Chronic, ongoing. Continue inhaler regimen. Will check spirometry next visit in 3 months. She had CT abdomen 07/09/20 which showed multiple lung nodules. She has an appointment this month to follow-up with her oncologist. If needed, will place referral to pulmonology for follow-up. Recommend complete tobacco cessation.

## 2020-09-16 NOTE — Telephone Encounter (Signed)
Requested medications are due for refill today.  yes  Requested medications are on the active medications list.  yes  Last refill. 05/23/2020  Future visit scheduled.   yes  Notes to clinic.  Medication not delegated. Listed as a historical medication.

## 2020-09-16 NOTE — Assessment & Plan Note (Signed)
Noted on CT scan 07/09/20. She was taking lipitor in the past, however stopped taking it when she read an article about statins leading to dementia. Her father and multiple family members had dementia and she did not want to increase her chances of having it. She is interested in taking any other medication for cholesterol as she knows she is at increased risk for heart disease. Will start zetia daily. Check lipid panel today. Follow-up in 3 months.

## 2020-09-17 LAB — COMPREHENSIVE METABOLIC PANEL
ALT: 21 IU/L (ref 0–32)
AST: 15 IU/L (ref 0–40)
Albumin/Globulin Ratio: 1.4 (ref 1.2–2.2)
Albumin: 4.1 g/dL (ref 3.8–4.9)
Alkaline Phosphatase: 145 IU/L — ABNORMAL HIGH (ref 44–121)
BUN/Creatinine Ratio: 29 — ABNORMAL HIGH (ref 9–23)
BUN: 18 mg/dL (ref 6–24)
Bilirubin Total: <0.2 mg/dL (ref 0.0–1.2)
CO2: 26 mmol/L (ref 20–29)
Calcium: 9.5 mg/dL (ref 8.7–10.2)
Chloride: 96 mmol/L (ref 96–106)
Creatinine, Ser: 0.62 mg/dL (ref 0.57–1.00)
Globulin, Total: 3 g/dL (ref 1.5–4.5)
Glucose: 251 mg/dL — ABNORMAL HIGH (ref 65–99)
Potassium: 5 mmol/L (ref 3.5–5.2)
Sodium: 138 mmol/L (ref 134–144)
Total Protein: 7.1 g/dL (ref 6.0–8.5)
eGFR: 104 mL/min/{1.73_m2} (ref >59)

## 2020-09-17 LAB — LIPID PANEL W/O CHOL/HDL RATIO
Cholesterol, Total: 297 mg/dL — ABNORMAL HIGH (ref 100–199)
HDL: 37 mg/dL — ABNORMAL LOW (ref >39)
LDL Chol Calc (NIH): 170 mg/dL — ABNORMAL HIGH (ref 0–99)
Triglycerides: 454 mg/dL — ABNORMAL HIGH (ref 0–149)
VLDL Cholesterol Cal: 90 mg/dL — ABNORMAL HIGH (ref 5–40)

## 2020-09-17 LAB — CBC WITH DIFFERENTIAL/PLATELET
Basophils Absolute: 0 10*3/uL (ref 0.0–0.2)
Basos: 0 %
EOS (ABSOLUTE): 0.3 10*3/uL (ref 0.0–0.4)
Eos: 3 %
Hematocrit: 45.4 % (ref 34.0–46.6)
Hemoglobin: 14.8 g/dL (ref 11.1–15.9)
Immature Grans (Abs): 0.1 10*3/uL (ref 0.0–0.1)
Immature Granulocytes: 1 %
Lymphocytes Absolute: 2.3 10*3/uL (ref 0.7–3.1)
Lymphs: 21 %
MCH: 29 pg (ref 26.6–33.0)
MCHC: 32.6 g/dL (ref 31.5–35.7)
MCV: 89 fL (ref 79–97)
Monocytes Absolute: 0.5 10*3/uL (ref 0.1–0.9)
Monocytes: 5 %
Neutrophils Absolute: 7.4 10*3/uL — ABNORMAL HIGH (ref 1.4–7.0)
Neutrophils: 70 %
Platelets: 309 10*3/uL (ref 150–450)
RBC: 5.11 x10E6/uL (ref 3.77–5.28)
RDW: 12.6 % (ref 11.7–15.4)
WBC: 10.6 10*3/uL (ref 3.4–10.8)

## 2020-09-17 LAB — TSH: TSH: 0.96 u[IU]/mL (ref 0.450–4.500)

## 2020-09-17 NOTE — Telephone Encounter (Signed)
Patient seen by Ander Purpura yesterday. Has appointment in November with Jolene.

## 2020-09-18 ENCOUNTER — Other Ambulatory Visit: Payer: Self-pay

## 2020-09-18 MED ORDER — HYDROXYZINE HCL 25 MG PO TABS: 25.0000 mg | ORAL_TABLET | Freq: Three times a day (TID) | ORAL | 0 refills | Status: DC

## 2020-09-18 NOTE — Addendum Note (Signed)
Addended by: Vance Peper A on: 09/18/2020 08:19 AM   Modules accepted: Orders

## 2020-09-25 ENCOUNTER — Ambulatory Visit: Payer: Medicaid Other | Admitting: Surgery

## 2020-09-30 ENCOUNTER — Other Ambulatory Visit: Payer: Self-pay

## 2020-09-30 ENCOUNTER — Ambulatory Visit: Payer: Medicaid Other | Admitting: Surgery

## 2020-09-30 ENCOUNTER — Encounter: Payer: Self-pay | Admitting: Surgery

## 2020-09-30 ENCOUNTER — Telehealth: Payer: Self-pay

## 2020-09-30 VITALS — BP 155/101 | HR 85 | Temp 98.0°F | Ht 66.0 in

## 2020-09-30 DIAGNOSIS — L723 Sebaceous cyst: Secondary | ICD-10-CM | POA: Diagnosis not present

## 2020-09-30 NOTE — H&P (View-Only) (Signed)
09/30/2020  Reason for Visit: Sebaceous cyst of the back  Referring Provider: Vance Peper, NP  History of Present Illness: Margaret Church is a 55 y.o. female presenting for evaluation of a sebaceous cyst on her back.  Patient reports that she has had this for about 30 years and she has had multiple episodes of flareups where the cyst area would enlarge and become more tender and swollen with episodes of drainage at times.  She denies needing or getting any incision and drainage procedures in the past.  She reports that she has had multiple medical issues and the sebaceous cyst over the years never became a priority.  However she feels that now the cyst is bothering her more as it is causing more discomfort in the surrounding area.  She reports that the cyst itself has not flared up and is not become bigger but she feels that she is getting more nerve pain around it.  Denies any recent flareup over the past 2 to 3 months.  Of note, the patient has medical history significant for COPD, cancer of the appendix, diabetes, Von Willebrand disease and multiple sclerosis among others.  She has had prior significant bleeding episodes from her surgeries which is how her von Willebrand disease got diagnosed.  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   Clotting disorder ([name])    Colon cancer ([name]) 2009   COPD (chronic obstructive pulmonary disease) ([place])    Depression    Diabetes mellitus without complication ([name])    Family history of leukemia    Family history of lymphoma    Family history of skin cancer    Family history of thyroid cancer    GERD (gastroesophageal reflux disease)    History of esophageal cancer    Hyperlipidemia    Hypertension    Neuromuscular disorder ([name])    Oxygen deficiency    Seizures ([name])    Sleep apnea    Thyroid cancer ([name]) 2014   Thyroid disease    Von Willebrand  disease, type I ([name])      Past Surgical History: Past Surgical History:  Procedure Laterality Date   ABDOMINAL HYSTERECTOMY     APPENDECTOMY     COLON SURGERY     COLONOSCOPY WITH PROPOFOL N/A 12/21/2019   Procedure: COLONOSCOPY WITH PROPOFOL;  Surgeon: Lin Landsman, MD;  Location: [facility] ENDOSCOPY;  Service: Gastroenterology;  Laterality: N/A;   ESOPHAGOGASTRODUODENOSCOPY (EGD) WITH PROPOFOL N/A 12/21/2019   Procedure: ESOPHAGOGASTRODUODENOSCOPY (EGD) WITH PROPOFOL;  Surgeon: Lin Landsman, MD;  Location: [facility]OSCOPY;  Service: Gastroenterology;  Laterality: N/A;   HERNIA REPAIR     KNEE SURGERY     tonsils surgery      Home Medications: Prior to Admission medications   Medication Sig Start Date End Date Taking? Authorizing Provider  ACCU-CHEK GUIDE test strip USE TO TEST BLOOD SUGAR FOUR TIMES DAILY 11/26/19  Yes Cannady, Jolene T, NP  Accu-Chek Softclix Lancets lancets USE TO TEST BLOOD SUGAR FOUR TIMES DAILY 10/31/19  Yes Cannady, Jolene T, NP  albuterol (PROVENTIL) (2.5 MG/3ML) 0.083% nebulizer solution Take 3 mLs (2.5 mg total) by nebulization every 6 (six) hours as needed for wheezing or shortness of breath. 12/20/19  Yes Tyler Pita, MD  albuterol (VENTOLIN HFA) 108 (90 Base) MCG/ACT inhaler Inhale 2 puffs into the lungs every 6 (six) hours as needed for wheezing  or shortness of breath. 09/01/19  Yes Cannady, Jolene T, NP  AMITIZA 8 MCG capsule Take 8 mcg by mouth 2 (two) times daily. 09/11/20  Yes [provider]  amLODipine (NORVASC) 5 MG tablet Take 1 tablet (5 mg total) by mouth daily. 09/16/20  Yes McElwee, Lauren A, NP  amphetamine-dextroamphetamine (ADDERALL) 12.5 MG tablet Take 12.5 mg by mouth 2 (two) times daily.   Yes [provider]  atenolol (TENORMIN) 50 MG tablet Take 1 tablet (50 mg total) by mouth daily. 09/16/20  Yes McElwee, Lauren A, NP  Blood Glucose Monitoring Suppl (ACCU-CHEK GUIDE ME) w/Device KIT USE TO TEST BLOOD SUGAR FOUR  TIMES DAILY 10/31/19  Yes Cannady, Jolene T, NP  carbamazepine (TEGRETOL) 200 MG tablet Take 100 mg by mouth 3 (three) times daily. 08/18/20  Yes [provider]  diazepam (VALIUM) 5 MG tablet Take 5 mg by mouth every 6 (six) hours as needed for anxiety.   Yes [provider]  DULCOLAX 10 MG suppository Place 10 mg rectally daily as needed. 06/10/20  Yes [provider]  DULoxetine (CYMBALTA) 30 MG capsule Take 1 capsule (30 mg total) by mouth 2 (two) times daily. 09/01/19  Yes Cannady, Jolene T, NP  ezetimibe (ZETIA) 10 MG tablet Take 1 tablet (10 mg total) by mouth daily. 09/16/20  Yes McElwee, Lauren A, NP  Fluticasone-Salmeterol (ADVAIR) 250-50 MCG/DOSE AEPB Inhale 1 puff into the lungs daily. 12/11/19  Yes Cannady, Jolene T, NP  hydrOXYzine (ATARAX/VISTARIL) 25 MG tablet Take 1 tablet (25 mg total) by mouth 3 (three) times daily. 09/18/20  Yes Cannady, Jolene T, NP  insulin glargine (LANTUS) 100 UNIT/ML Solostar Pen Inject 50 Units into the skin at bedtime. 48-50 units at bedtime 09/16/20  Yes McElwee, Lauren A, NP  insulin lispro (HUMALOG KWIKPEN) 100 UNIT/ML KwikPen Inject 4 to 18 units subcutaneously up to 3-4 times a day. 09/16/20  Yes McElwee, Lauren A, NP  Insulin Pen Needle (PEN NEEDLES) 32G X 4 MM MISC Use for each dose of lantus and humalog injections 09/16/20  Yes McElwee, Lauren A, NP  levothyroxine (SYNTHROID) 200 MCG tablet Take 1 tablet (200 mcg total) by mouth daily. With 25 mcg. (total 225 mcg daily) 09/05/20  Yes Cannady, Jolene T, NP  polyethylene glycol powder (GLYCOLAX/MIRALAX) 17 GM/SCOOP powder Take 255 g by mouth daily. 12/04/19  Yes Vanga, Tally Due, MD  pregabalin (LYRICA) 25 MG capsule Take by mouth. 09/02/20  Yes [provider]  Tiotropium Bromide Monohydrate (SPIRIVA RESPIMAT) 2.5 MCG/ACT AERS Inhale 2 puffs into the lungs daily. 01/16/20  Yes Tyler Pita, MD  tiZANidine (ZANAFLEX) 4 MG tablet Take 1 tablet by mouth three times daily as  needed 09/17/20  Yes McElwee, Lauren A, NP    Allergies: Allergies  Allergen Reactions   Latex     Other reaction(s): Hive   Tetanus Antitoxin     Other reaction(s): Arm Swelling, Fever, N/V   Gabapentin Rash    Other reaction(s): Pins and Needles Sensation on head   Other Other (See Comments), Rash and Swelling    SURGICAL STAPLES CAUSE LOCAL SWELLING   Tramadol Rash    Social History:  reports that she has been smoking cigarettes. She has a 2.00 pack-year smoking history. She has never used smokeless tobacco. She reports that she does not currently use drugs after having used the following drugs: Marijuana. She reports that she does not drink alcohol.   Family History: Family History  Problem Relation Age of  Onset   Lung cancer Mother 76   Cancer Mother        skin cancer;    Heart disease Mother    Alzheimer's disease Father    Cancer Father        skin cancer   Hypertension Father    Hip fracture Brother    Hypertension Brother    Skin cancer Brother    Alcohol abuse Brother    Hypertension Brother    Other Brother        large tumor on thigh removed in [place]   Colon polyps Daughter 28   Skin cancer Maternal Aunt    Skin cancer Maternal Aunt    Von Willebrand disease Paternal Aunt        and carcinoid disease   Esophageal cancer Paternal Aunt    Multiple myeloma Paternal Uncle    Cancer Paternal Uncle        unk type   Liver cancer Paternal Uncle    Lymphoma Paternal Grandmother    Thyroid cancer Other     Review of Systems: Review of Systems  Constitutional:  Negative for chills and fever.  HENT:  Negative for hearing loss.   Respiratory:  Negative for shortness of breath.   Cardiovascular:  Negative for chest pain.  Gastrointestinal:  Negative for abdominal pain, nausea and vomiting.  Genitourinary:  Negative for dysuria.  Musculoskeletal:  Positive for back pain and myalgias.  Skin:  Negative for rash.       Sebaceous cyst of the back  Neurological:   Negative for dizziness.  Psychiatric/Behavioral:  Negative for depression.    Physical Exam BP (!) 155/101   Pulse 85   Temp 98 F (36.7 C) (Oral)   Ht 5' 6" (1.676 m)   SpO2 97%   BMI 33.57 kg/m  CONSTITUTIONAL: No acute distress, well-nourished HEENT:  Normocephalic, atraumatic, extraocular motion intact. NECK: Trachea is midline, and there is no jugular venous distension.  RESPIRATORY:  Lungs are clear, and breath sounds are equal bilaterally. Normal respiratory effort without pathologic use of accessory muscles. CARDIOVASCULAR: Heart is regular without murmurs, gallops, or rubs. MUSCULOSKELETAL:  Normal muscle strength and tone in all four extremities.  No peripheral edema or cyanosis. SKIN: The patient has a 2 cm area in the upper back in the midline consistent with a sebaceous cyst.  Currently there is no skin erythema or induration but there is firmness palpable likely from prior scarring.  No drainage at this point either. NEUROLOGIC:  Motor and sensation is grossly normal.  Cranial nerves are grossly intact. PSYCH:  Alert and oriented to person, place and time. Affect is normal.  Laboratory Analysis: Labs on 09/16/2020: Sodium 138, potassium 5, chloride 96, CO2 26, BUN 18, creatinine 0.62.  Total bilirubin less than 0.2, AST 15, ALT 21, alkaline phosphatase 145.  WBC 10.6, hemoglobin 14.8, hematocrit 45.4, platelets 309.  Hemoglobin A1c 10.  Imaging: No results found.  Assessment and Plan: This is a 54 y.o. female with a chronic upper back sebaceous cyst.  - The patient feels that the cyst has become more symptomatic with pain in the surrounding area although it has not flared up over the last 2 to 3 months.  The patient wishes to have this excised.  Discussed with the patient the role for excision of the sebaceous cyst and discussed with her the options for doing this as an office procedure versus in the operating room.  The patient would rather do this in   the operating room  under sedation and she also mentions that with her von Willebrand's disease, she would rather do this in the operating room instead of in the office.  I discussed with her that we could certainly do this that way and be able to use cautery in a more controlled setting.  The patient reports that she has had significant bleeding in the past with prior surgeries and even this morning she had a cut on her chin that has continued to ooze or bleed during the day.  We will place order for coagulation numbers and a type and screen for potential need for FFP on the day of surgery.  I will also send a clearance form to Dr. Rogue Bussing that is following her for this.  She has an appointment with him on 10/10/2020.  Discussed with her the risks of bleeding, infection, and injury to surrounding structures, postoperative recovery and pain control and she is willing to proceed. - We will schedule her tentatively for 10/17/2020.  We will send clearance to Dr. Rogue Bussing for surgery.  Face-to-face time spent with the patient and care providers was 60 minutes, with more than 50% of the time spent counseling, educating, and coordinating care of the patient.     Melvyn Neth, [facility]

## 2020-09-30 NOTE — Progress Notes (Signed)
09/30/2020  Reason for Visit: Sebaceous cyst of the back  Referring Provider: Vance Peper, NP  History of Present Illness: Margaret Church is a 55 y.o. female presenting for evaluation of a sebaceous cyst on her back.  Patient reports that she has had this for about 30 years and she has had multiple episodes of flareups where the cyst area would enlarge and become more tender and swollen with episodes of drainage at times.  She denies needing or getting any incision and drainage procedures in the past.  She reports that she has had multiple medical issues and the sebaceous cyst over the years never became a priority.  However she feels that now the cyst is bothering her more as it is causing more discomfort in the surrounding area.  She reports that the cyst itself has not flared up and is not become bigger but she feels that she is getting more nerve pain around it.  Denies any recent flareup over the past 2 to 3 months.  Of note, the patient has medical history significant for COPD, cancer of the appendix, diabetes, Von Willebrand disease and multiple sclerosis among others.  She has had prior significant bleeding episodes from her surgeries which is how her von Willebrand disease got diagnosed.  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   Clotting disorder ([name])    Colon cancer ([name]) 2009   COPD (chronic obstructive pulmonary disease) ([place])    Depression    Diabetes mellitus without complication ([name])    Family history of leukemia    Family history of lymphoma    Family history of skin cancer    Family history of thyroid cancer    GERD (gastroesophageal reflux disease)    History of esophageal cancer    Hyperlipidemia    Hypertension    Neuromuscular disorder ([name])    Oxygen deficiency    Seizures ([name])    Sleep apnea    Thyroid cancer ([name]) 2014   Thyroid disease    Von Willebrand  disease, type I ([name])      Past Surgical History: Past Surgical History:  Procedure Laterality Date   ABDOMINAL HYSTERECTOMY     APPENDECTOMY     COLON SURGERY     COLONOSCOPY WITH PROPOFOL N/A 12/21/2019   Procedure: COLONOSCOPY WITH PROPOFOL;  Surgeon: Lin Landsman, MD;  Location: [facility] ENDOSCOPY;  Service: Gastroenterology;  Laterality: N/A;   ESOPHAGOGASTRODUODENOSCOPY (EGD) WITH PROPOFOL N/A 12/21/2019   Procedure: ESOPHAGOGASTRODUODENOSCOPY (EGD) WITH PROPOFOL;  Surgeon: Lin Landsman, MD;  Location: [facility]OSCOPY;  Service: Gastroenterology;  Laterality: N/A;   HERNIA REPAIR     KNEE SURGERY     tonsils surgery      Home Medications: Prior to Admission medications   Medication Sig Start Date End Date Taking? Authorizing Provider  ACCU-CHEK GUIDE test strip USE TO TEST BLOOD SUGAR FOUR TIMES DAILY 11/26/19  Yes Cannady, Jolene T, NP  Accu-Chek Softclix Lancets lancets USE TO TEST BLOOD SUGAR FOUR TIMES DAILY 10/31/19  Yes Cannady, Jolene T, NP  albuterol (PROVENTIL) (2.5 MG/3ML) 0.083% nebulizer solution Take 3 mLs (2.5 mg total) by nebulization every 6 (six) hours as needed for wheezing or shortness of breath. 12/20/19  Yes Tyler Pita, MD  albuterol (VENTOLIN HFA) 108 (90 Base) MCG/ACT inhaler Inhale 2 puffs into the lungs every 6 (six) hours as needed for wheezing  or shortness of breath. 09/01/19  Yes Cannady, Jolene T, NP  AMITIZA 8 MCG capsule Take 8 mcg by mouth 2 (two) times daily. 09/11/20  Yes [provider]  amLODipine (NORVASC) 5 MG tablet Take 1 tablet (5 mg total) by mouth daily. 09/16/20  Yes McElwee, Lauren A, NP  amphetamine-dextroamphetamine (ADDERALL) 12.5 MG tablet Take 12.5 mg by mouth 2 (two) times daily.   Yes [provider]  atenolol (TENORMIN) 50 MG tablet Take 1 tablet (50 mg total) by mouth daily. 09/16/20  Yes McElwee, Lauren A, NP  Blood Glucose Monitoring Suppl (ACCU-CHEK GUIDE ME) w/Device KIT USE TO TEST BLOOD SUGAR FOUR  TIMES DAILY 10/31/19  Yes Cannady, Jolene T, NP  carbamazepine (TEGRETOL) 200 MG tablet Take 100 mg by mouth 3 (three) times daily. 08/18/20  Yes [provider]  diazepam (VALIUM) 5 MG tablet Take 5 mg by mouth every 6 (six) hours as needed for anxiety.   Yes [provider]  DULCOLAX 10 MG suppository Place 10 mg rectally daily as needed. 06/10/20  Yes [provider]  DULoxetine (CYMBALTA) 30 MG capsule Take 1 capsule (30 mg total) by mouth 2 (two) times daily. 09/01/19  Yes Cannady, Jolene T, NP  ezetimibe (ZETIA) 10 MG tablet Take 1 tablet (10 mg total) by mouth daily. 09/16/20  Yes McElwee, Lauren A, NP  Fluticasone-Salmeterol (ADVAIR) 250-50 MCG/DOSE AEPB Inhale 1 puff into the lungs daily. 12/11/19  Yes Cannady, Jolene T, NP  hydrOXYzine (ATARAX/VISTARIL) 25 MG tablet Take 1 tablet (25 mg total) by mouth 3 (three) times daily. 09/18/20  Yes Cannady, Jolene T, NP  insulin glargine (LANTUS) 100 UNIT/ML Solostar Pen Inject 50 Units into the skin at bedtime. 48-50 units at bedtime 09/16/20  Yes McElwee, Lauren A, NP  insulin lispro (HUMALOG KWIKPEN) 100 UNIT/ML KwikPen Inject 4 to 18 units subcutaneously up to 3-4 times a day. 09/16/20  Yes McElwee, Lauren A, NP  Insulin Pen Needle (PEN NEEDLES) 32G X 4 MM MISC Use for each dose of lantus and humalog injections 09/16/20  Yes McElwee, Lauren A, NP  levothyroxine (SYNTHROID) 200 MCG tablet Take 1 tablet (200 mcg total) by mouth daily. With 25 mcg. (total 225 mcg daily) 09/05/20  Yes Cannady, Jolene T, NP  polyethylene glycol powder (GLYCOLAX/MIRALAX) 17 GM/SCOOP powder Take 255 g by mouth daily. 12/04/19  Yes Vanga, Tally Due, MD  pregabalin (LYRICA) 25 MG capsule Take by mouth. 09/02/20  Yes [provider]  Tiotropium Bromide Monohydrate (SPIRIVA RESPIMAT) 2.5 MCG/ACT AERS Inhale 2 puffs into the lungs daily. 01/16/20  Yes Tyler Pita, MD  tiZANidine (ZANAFLEX) 4 MG tablet Take 1 tablet by mouth three times daily as  needed 09/17/20  Yes McElwee, Lauren A, NP    Allergies: Allergies  Allergen Reactions   Latex     Other reaction(s): Hive   Tetanus Antitoxin     Other reaction(s): Arm Swelling, Fever, N/V   Gabapentin Rash    Other reaction(s): Pins and Needles Sensation on head   Other Other (See Comments), Rash and Swelling    SURGICAL STAPLES CAUSE LOCAL SWELLING   Tramadol Rash    Social History:  reports that she has been smoking cigarettes. She has a 2.00 pack-year smoking history. She has never used smokeless tobacco. She reports that she does not currently use drugs after having used the following drugs: Marijuana. She reports that she does not drink alcohol.   Family History: Family History  Problem Relation Age of  Onset   Lung cancer Mother 76   Cancer Mother        skin cancer;    Heart disease Mother    Alzheimer's disease Father    Cancer Father        skin cancer   Hypertension Father    Hip fracture Brother    Hypertension Brother    Skin cancer Brother    Alcohol abuse Brother    Hypertension Brother    Other Brother        large tumor on thigh removed in [place]   Colon polyps Daughter 28   Skin cancer Maternal Aunt    Skin cancer Maternal Aunt    Von Willebrand disease Paternal Aunt        and carcinoid disease   Esophageal cancer Paternal Aunt    Multiple myeloma Paternal Uncle    Cancer Paternal Uncle        unk type   Liver cancer Paternal Uncle    Lymphoma Paternal Grandmother    Thyroid cancer Other     Review of Systems: Review of Systems  Constitutional:  Negative for chills and fever.  HENT:  Negative for hearing loss.   Respiratory:  Negative for shortness of breath.   Cardiovascular:  Negative for chest pain.  Gastrointestinal:  Negative for abdominal pain, nausea and vomiting.  Genitourinary:  Negative for dysuria.  Musculoskeletal:  Positive for back pain and myalgias.  Skin:  Negative for rash.       Sebaceous cyst of the back  Neurological:   Negative for dizziness.  Psychiatric/Behavioral:  Negative for depression.    Physical Exam BP (!) 155/101   Pulse 85   Temp 98 F (36.7 C) (Oral)   Ht 5' 6" (1.676 m)   SpO2 97%   BMI 33.57 kg/m  CONSTITUTIONAL: No acute distress, well-nourished HEENT:  Normocephalic, atraumatic, extraocular motion intact. NECK: Trachea is midline, and there is no jugular venous distension.  RESPIRATORY:  Lungs are clear, and breath sounds are equal bilaterally. Normal respiratory effort without pathologic use of accessory muscles. CARDIOVASCULAR: Heart is regular without murmurs, gallops, or rubs. MUSCULOSKELETAL:  Normal muscle strength and tone in all four extremities.  No peripheral edema or cyanosis. SKIN: The patient has a 2 cm area in the upper back in the midline consistent with a sebaceous cyst.  Currently there is no skin erythema or induration but there is firmness palpable likely from prior scarring.  No drainage at this point either. NEUROLOGIC:  Motor and sensation is grossly normal.  Cranial nerves are grossly intact. PSYCH:  Alert and oriented to person, place and time. Affect is normal.  Laboratory Analysis: Labs on 09/16/2020: Sodium 138, potassium 5, chloride 96, CO2 26, BUN 18, creatinine 0.62.  Total bilirubin less than 0.2, AST 15, ALT 21, alkaline phosphatase 145.  WBC 10.6, hemoglobin 14.8, hematocrit 45.4, platelets 309.  Hemoglobin A1c 10.  Imaging: No results found.  Assessment and Plan: This is a 54 y.o. female with a chronic upper back sebaceous cyst.  - The patient feels that the cyst has become more symptomatic with pain in the surrounding area although it has not flared up over the last 2 to 3 months.  The patient wishes to have this excised.  Discussed with the patient the role for excision of the sebaceous cyst and discussed with her the options for doing this as an office procedure versus in the operating room.  The patient would rather do this in   the operating room  under sedation and she also mentions that with her von Willebrand's disease, she would rather do this in the operating room instead of in the office.  I discussed with her that we could certainly do this that way and be able to use cautery in a more controlled setting.  The patient reports that she has had significant bleeding in the past with prior surgeries and even this morning she had a cut on her chin that has continued to ooze or bleed during the day.  We will place order for coagulation numbers and a type and screen for potential need for FFP on the day of surgery.  I will also send a clearance form to Dr. Brahmanday that is following her for this.  She has an appointment with him on 10/10/2020.  Discussed with her the risks of bleeding, infection, and injury to surrounding structures, postoperative recovery and pain control and she is willing to proceed. - We will schedule her tentatively for 10/17/2020.  We will send clearance to Dr. Brahmanday for surgery.  Face-to-face time spent with the patient and care providers was 60 minutes, with more than 50% of the time spent counseling, educating, and coordinating care of the patient.     Jose Luis Piscoya, MD [facility]    

## 2020-09-30 NOTE — Patient Instructions (Signed)
Our surgery scheduler will contact you within 24-48 hours to schedule your surgery. Please have the [facility] surgery sheet available when speaking with her.   Be sure to keep your appointment with Dr.Brahamanday.

## 2020-09-30 NOTE — Telephone Encounter (Signed)
Medical/Hematology clearance faxed to Dr.Brahmanday at this time- [phone]-patient has histyory of Von Willebrands  and also need to know if FFP is needed prior to surgery.

## 2020-10-01 ENCOUNTER — Telehealth: Payer: Self-pay | Admitting: Surgery

## 2020-10-01 NOTE — Telephone Encounter (Signed)
Outgoing call is made, left message for patient to call.  Please provide the following for Pre-Admission date/time, COVID Testing date and Surgery date.  Surgery Date: 10/17/20 Preadmission Testing Date: 10/08/20 (phone 1p-5p) Covid Testing Date: Not needed.     Also patient will need to call at [phone], between 1-3:00pm the day before surgery, to find out what time to arrive for surgery.

## 2020-10-01 NOTE — Telephone Encounter (Signed)
Incoming call from patient. She is informed of all dates regarding her surgery, information given.  Patient verbalized understanding.

## 2020-10-08 ENCOUNTER — Other Ambulatory Visit: Payer: Medicaid Other

## 2020-10-08 ENCOUNTER — Ambulatory Visit: Payer: Medicaid Other | Admitting: Internal Medicine

## 2020-10-08 ENCOUNTER — Encounter
Admission: RE | Admit: 2020-10-08 | Discharge: 2020-10-08 | Disposition: A | Discharge status: Home / Self Care | Payer: Medicaid Other | Source: Ambulatory Visit | Attending: Surgery | Admitting: Surgery

## 2020-10-08 ENCOUNTER — Other Ambulatory Visit: Payer: Self-pay

## 2020-10-08 HISTORY — DX: Multiple sclerosis: G35

## 2020-10-08 NOTE — Patient Instructions (Addendum)
Your procedure is scheduled on: Thursday, September 1 Report to the Registration Desk on the 1st floor of the [facility]. To find out your arrival time, please call [phone] between 1PM - 3PM on: Wednesday, August 31  REMEMBER: Instructions that are not followed completely may result in serious medical risk, up to and including death; or upon the discretion of your surgeon and anesthesiologist your surgery may need to be rescheduled.  Do not eat food after midnight the night before surgery.  No gum chewing, lozengers or hard candies.  You may however, drink water up to 2 hours before you are scheduled to arrive for your surgery. Do not drink anything within 2 hours of your scheduled arrival time.  TAKE THESE MEDICATIONS THE MORNING OF SURGERY WITH A SIP OF WATER:  Albuterol nebulizer Amlodipine Atenolol Carbamazepine (Tegretol) Duloxetine (Cymbalta) Ezetimibe (Zetia) Advair inhaler Hydroxyzine Levothyroxine  Use inhalers on the day of surgery and bring your albuterol inhaler to the hospital.  Take 1/2 of usual insulin dose the night before surgery and none on the morning of surgery. Only take 25 units of Lantus the night before surgery. Do not take ANY insulin on the morning of surgery.  One week prior to surgery: starting August 25 Stop Anti-inflammatories (NSAIDS) such as Advil, Aleve, Ibuprofen, Motrin, Naproxen, Naprosyn and Aspirin based products such as Excedrin, Goodys Powder, BC Powder. Stop ANY OVER THE COUNTER supplements until after surgery. You may however, continue to take Tylenol if needed for pain up until the day of surgery.  No Alcohol for 24 hours before or after surgery.  No Smoking including e-cigarettes for 24 hours prior to surgery.  No chewable tobacco products for at least 6 hours prior to surgery.  No nicotine patches on the day of surgery.  Do not use any "recreational" drugs for at least a week prior to your surgery.  Please be advised that  the combination of cocaine and anesthesia may have negative outcomes, up to and including death. If you test positive for cocaine, your surgery will be cancelled.  On the morning of surgery brush your teeth with toothpaste and water, you may rinse your mouth with mouthwash if you wish. Do not swallow any toothpaste or mouthwash.  Do not wear jewelry, make-up, hairpins, clips or nail polish.  Do not wear lotions, powders, or perfumes.   Do not shave body from the neck down 48 hours prior to surgery just in case you cut yourself which could leave a site for infection.  Also, freshly shaved skin may become irritated if using the CHG soap.  Contact lenses, hearing aids and dentures may not be worn into surgery.  Do not bring valuables to the hospital. [facility] is not responsible for any missing/lost belongings or valuables.   Use CHG Soap as directed on instruction sheet.  Notify your doctor if there is any change in your medical condition (cold, fever, infection).  Wear comfortable clothing (specific to your surgery type) to the hospital.  After surgery, you can help prevent lung complications by doing breathing exercises.  Take deep breaths and cough every 1-2 hours. Your doctor may order a device called an Incentive Spirometer to help you take deep breaths.  If you are being discharged the day of surgery, you will not be allowed to drive home. You will need a responsible adult (18 years or older) to drive you home and stay with you that night.   If you are taking public transportation, you will  need to have a responsible adult (18 years or older) with you. Please confirm with your physician that it is acceptable to use public transportation.   Please call the [facility] at [phone] if you have any questions about these instructions.  Surgery Visitation Policy:  Patients undergoing a surgery or procedure may have one family member or support person with  them as long as that person is not COVID-19 positive or experiencing its symptoms.  That person may remain in the waiting area during the procedure.

## 2020-10-10 ENCOUNTER — Encounter
Admission: RE | Admit: 2020-10-10 | Discharge: 2020-10-10 | Disposition: A | Discharge status: Home / Self Care | Payer: Medicaid Other | Source: Ambulatory Visit | Attending: Surgery | Admitting: Surgery

## 2020-10-10 ENCOUNTER — Other Ambulatory Visit: Payer: Self-pay

## 2020-10-10 ENCOUNTER — Inpatient Hospital Stay: Payer: Medicaid Other | Attending: Internal Medicine

## 2020-10-10 ENCOUNTER — Inpatient Hospital Stay (HOSPITAL_BASED_OUTPATIENT_CLINIC_OR_DEPARTMENT_OTHER): Payer: Medicaid Other | Admitting: Internal Medicine

## 2020-10-10 VITALS — BP 97/60 | HR 77 | Temp 97.4°F | Resp 18 | Ht 66.0 in

## 2020-10-10 DIAGNOSIS — D6800 Von Willebrand disease, unspecified: Secondary | ICD-10-CM

## 2020-10-10 DIAGNOSIS — Z85038 Personal history of other malignant neoplasm of large intestine: Secondary | ICD-10-CM | POA: Insufficient documentation

## 2020-10-10 DIAGNOSIS — Z8509 Personal history of malignant neoplasm of other digestive organs: Secondary | ICD-10-CM | POA: Insufficient documentation

## 2020-10-10 DIAGNOSIS — Z01818 Encounter for other preprocedural examination: Secondary | ICD-10-CM | POA: Insufficient documentation

## 2020-10-10 DIAGNOSIS — C73 Malignant neoplasm of thyroid gland: Secondary | ICD-10-CM

## 2020-10-10 DIAGNOSIS — D751 Secondary polycythemia: Secondary | ICD-10-CM | POA: Diagnosis not present

## 2020-10-10 DIAGNOSIS — Z85850 Personal history of malignant neoplasm of thyroid: Secondary | ICD-10-CM | POA: Insufficient documentation

## 2020-10-10 LAB — CBC WITH DIFFERENTIAL/PLATELET
Abs Immature Granulocytes: 0.04 10*3/uL (ref 0.00–0.07)
Basophils Absolute: 0.1 10*3/uL (ref 0.0–0.1)
Basophils Relative: 0 %
Eosinophils Absolute: 0.2 10*3/uL (ref 0.0–0.5)
Eosinophils Relative: 2 %
HCT: 46.2 % — ABNORMAL HIGH (ref 36.0–46.0)
Hemoglobin: 15.3 g/dL — ABNORMAL HIGH (ref 12.0–15.0)
Immature Granulocytes: 0 %
Lymphocytes Relative: 24 %
Lymphs Abs: 2.7 10*3/uL (ref 0.7–4.0)
MCH: 29.2 pg (ref 26.0–34.0)
MCHC: 33.1 g/dL (ref 30.0–36.0)
MCV: 88.2 fL (ref 80.0–100.0)
Monocytes Absolute: 0.7 10*3/uL (ref 0.1–1.0)
Monocytes Relative: 6 %
Neutro Abs: 7.6 10*3/uL (ref 1.7–7.7)
Neutrophils Relative %: 68 %
Platelets: 315 10*3/uL (ref 150–400)
RBC: 5.24 MIL/uL — ABNORMAL HIGH (ref 3.87–5.11)
RDW: 12.5 % (ref 11.5–15.5)
WBC: 11.3 10*3/uL — ABNORMAL HIGH (ref 4.0–10.5)
nRBC: 0 % (ref 0.0–0.2)

## 2020-10-10 LAB — TYPE AND SCREEN
ABO/RH(D): O POS
Antibody Screen: NEGATIVE

## 2020-10-10 LAB — PROTIME-INR
INR: 1 (ref 0.8–1.2)
Prothrombin Time: 13.4 seconds (ref 11.4–15.2)

## 2020-10-10 LAB — COMPREHENSIVE METABOLIC PANEL
ALT: 26 U/L (ref 0–44)
AST: 22 U/L (ref 15–41)
Albumin: 4.2 g/dL (ref 3.5–5.0)
Alkaline Phosphatase: 111 U/L (ref 38–126)
Anion gap: 13 (ref 5–15)
BUN: 28 mg/dL — ABNORMAL HIGH (ref 6–20)
CO2: 27 mmol/L (ref 22–32)
Calcium: 9.3 mg/dL (ref 8.9–10.3)
Chloride: 95 mmol/L — ABNORMAL LOW (ref 98–111)
Creatinine, Ser: 1.25 mg/dL — ABNORMAL HIGH (ref 0.44–1.00)
GFR, Estimated: 51 mL/min — ABNORMAL LOW (ref >60)
Glucose, Bld: 234 mg/dL — ABNORMAL HIGH (ref 70–99)
Potassium: 4 mmol/L (ref 3.5–5.1)
Sodium: 135 mmol/L (ref 135–145)
Total Bilirubin: 0.3 mg/dL (ref 0.3–1.2)
Total Protein: 7.5 g/dL (ref 6.5–8.1)

## 2020-10-10 LAB — APTT: aPTT: 32 seconds (ref 24–36)

## 2020-10-10 NOTE — Progress Notes (Signed)
[place] NOTE  Patient Care Team: Venita Lick, NP as PCP - General (Nurse Practitioner) Vladimir Faster, [facility] as Pharmacist (Pharmacist) Greg Cutter, LCSW as Social Worker (Licensed Clinical Social Worker)  CHIEF COMPLAINTS/PURPOSE OF CONSULTATION:   # VWD [reported history; work-up multiple times negative 2013 & 2021]- on surveillaince-no bleeding noted status post thyroidectomy [2014] s/p colectomy [2005]; OCT 2021- The VWF:Ag is normal. The VWF:RCo is normal. 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   # Hx of colon cancer [2009]- ? Stage II- ? 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   # TAH [at age 61]/ tonsillectomy bleeding [?  Reported diagnosis of von Willebrand]; MS; DM   Oncology History   No history exists.     HISTORY OF PRESENTING ILLNESS:  Margaret Church 55 y.o.  female with a history of multiple malignancies; also with reported history of von Willebrand disease -is here for follow-up.   In the interim patient has been evaluated by surgery awaiting sebaceous cyst excision.  Patient had EGD/colonoscopy-the interim no acute process noted.  No bleeding complications noted.   Patient continues to admit of easy bruising.  Otherwise no blood in stools or black or stools.  Complains of moderate fatigue.  Chronic joint pains.    Review of Systems  Constitutional:  Positive for malaise/fatigue. Negative for chills, diaphoresis, fever and weight loss.  HENT:  Negative for nosebleeds and sore throat.   Eyes:  Negative for double vision.  Respiratory:  Negative for cough, hemoptysis, sputum production, shortness of breath and wheezing.   Cardiovascular:  Negative for chest pain, palpitations, orthopnea and leg  swelling.  Gastrointestinal:  Negative for abdominal pain, blood in stool, constipation, diarrhea, heartburn, melena, nausea and vomiting.  Genitourinary:  Negative for dysuria, frequency and urgency.  Musculoskeletal:  Positive for back pain and joint pain.  Skin: Negative.  Negative for itching and rash.  Neurological:  Negative for dizziness, tingling, focal weakness, weakness and headaches.  Endo/Heme/Allergies:  Bruises/bleeds easily.  Psychiatric/Behavioral:  Negative for depression. The patient is not nervous/anxious and does not have insomnia.     MEDICAL HISTORY:  Past Medical History:  Diagnosis Date  . Anxiety   . Arthritis   . Asthma   . 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 Clotting disorder ([place])   . Colon cancer ([place]) 2009  . COPD (chronic obstructive pulmonary disease) ([place])   . Depression   . Diabetes mellitus without complication ([place])   . Family history of leukemia   . Family history of lymphoma   . Family history of skin cancer   . Family history of thyroid cancer   . GERD (gastroesophageal reflux disease)   . Hyperlipidemia   . Hypertension   . MS (multiple sclerosis) ([place])   . Neuromuscular disorder ([place])   . Oxygen deficiency   . Seizures ([place])   . Sleep apnea   . Thyroid cancer ([place]) 2014  . Thyroid disease   . Von Willebrand disease, type I ([place])     SURGICAL HISTORY: Past Surgical History:  Procedure Laterality Date  . ABDOMINAL HYSTERECTOMY    . APPENDECTOMY    . COLON SURGERY    . COLONOSCOPY WITH PROPOFOL N/A 12/21/2019   Procedure: COLONOSCOPY WITH PROPOFOL;  Surgeon: Lin Landsman, MD;  Location: [facility] ENDOSCOPY;  Service: Gastroenterology;  Laterality: N/A;  . ESOPHAGOGASTRODUODENOSCOPY (EGD) WITH PROPOFOL N/A 12/21/2019   Procedure: ESOPHAGOGASTRODUODENOSCOPY (EGD) WITH PROPOFOL;  Surgeon: Lin Landsman, MD;  Location: [facility] ENDOSCOPY;  Service: Gastroenterology;  Laterality: N/A;  . EXCISION OF BACK LESION N/A 10/17/2020   Procedure:  EXCISION OF BACK LESION;  Surgeon: Olean Ree, MD;  Location: [facility] ORS;  Service: General;  Laterality: N/A;  . HERNIA REPAIR    . KNEE SURGERY    . tonsils surgery      SOCIAL HISTORY: Social History   Socioeconomic History  . Marital status: Significant Other    Spouse name: Scott  . Number of children: Not on file  . Years of education: Not on file  . Highest education level: Not on file  Occupational History  . Not on file  Tobacco Use  . Smoking status: Every Day    Packs/day: 0.25    Years: 8.00    Pack years: 2.00    Types: Cigarettes  . Smokeless tobacco: Never  . Tobacco comments:    4 Cigs daily--12/20/2019  Vaping Use  . Vaping Use: Never used  Substance and Sexual Activity  . Alcohol use: Never  . Drug use: Not Currently    Types: Marijuana    Comment: medical marijuana card  . Sexual activity: Yes  Other Topics Concern  . 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 [place]oved [place] closer [place] ago [place] Social smoker 4-5 cig/ day; no alcohol; hx of marijuana/ palliative care [[place]]   Social Determinants of Health   Financial Resource Strain: Not on file  Food Insecurity: Not on file  Transportation Needs: Not on file  Physical Activity: Not on file  Stress: Not on file  Social Connections: Not on file  Intimate Partner Violence: Not on file    FAMILY HISTORY: Family History  Problem Relation Age of Onset  . Lung cancer Mother 23  . Cancer Mother        skin cancer;   . Heart disease Mother   . Alzheimer's disease Father   . Cancer Father        skin cancer  . Hypertension Father   . Hip fracture Brother   . Hypertension Brother   . Skin cancer Brother   . Alcohol abuse Brother   . Hypertension Brother   . Other Brother        large tumor on thigh removed in [place]  . Colon polyps Daughter 25  . Skin cancer Maternal Aunt    . Skin cancer Maternal Aunt   . Von Willebrand disease Paternal Aunt        and carcinoid disease  . Esophageal cancer Paternal Aunt   . Multiple myeloma Paternal Uncle   . Cancer Paternal Uncle        unk type  . Liver cancer Paternal Uncle   . Lymphoma Paternal Grandmother   . Thyroid cancer Other     ALLERGIES:  is allergic to latex, tetanus antitoxin, gabapentin, other, and tramadol.  MEDICATIONS:  Current Outpatient Medications  Medication Sig Dispense Refill  . ACCU-CHEK GUIDE test strip USE TO TEST BLOOD SUGAR FOUR TIMES DAILY 200 each 0  . Accu-Chek Softclix Lancets lancets USE TO TEST BLOOD SUGAR FOUR TIMES DAILY 100 each 0  . albuterol (PROVENTIL) (  2.5 MG/3ML) 0.083% nebulizer solution Take 3 mLs (2.5 mg total) by nebulization every 6 (six) hours as needed for wheezing or shortness of breath. 75 mL 12  . albuterol (VENTOLIN HFA) 108 (90 Base) MCG/ACT inhaler Inhale 2 puffs into the lungs every 6 (six) hours as needed for wheezing or shortness of breath. 18 g 4  . AMITIZA 8 MCG capsule Take 8 mcg by mouth 2 (two) times daily.    Marland Kitchen amLODipine (NORVASC) 5 MG tablet Take 1 tablet (5 mg total) by mouth daily. 90 tablet 4  . amphetamine-dextroamphetamine (ADDERALL) 12.5 MG tablet Take 12.5 mg by mouth 2 (two) times daily.    Marland Kitchen atenolol (TENORMIN) 50 MG tablet Take 1 tablet (50 mg total) by mouth daily. 90 tablet 4  . baclofen (LIORESAL) 20 MG tablet Take 20 mg by mouth 2 (two) times daily.    . Blood Glucose Monitoring Suppl (ACCU-CHEK GUIDE ME) w/Device KIT USE TO TEST BLOOD SUGAR FOUR TIMES DAILY 1 kit 0  . carbamazepine (TEGRETOL) 200 MG tablet Take 100 mg by mouth 3 (three) times daily.    . diazepam (VALIUM) 5 MG tablet Take 5 mg by mouth every 6 (six) hours as needed for anxiety.    . DULCOLAX 10 MG suppository Place 10 mg rectally daily as needed.    . ezetimibe (ZETIA) 10 MG tablet Take 1 tablet (10 mg total) by mouth daily. 90 tablet 0  . Fluticasone-Salmeterol (ADVAIR)  250-50 MCG/DOSE AEPB Inhale 1 puff into the lungs daily. 60 each 4  . insulin glargine (LANTUS) 100 UNIT/ML Solostar Pen Inject 50 Units into the skin at bedtime. 48-50 units at bedtime 45 mL 0  . insulin lispro (HUMALOG KWIKPEN) 100 UNIT/ML KwikPen Inject 4 to 18 units subcutaneously up to 3-4 times a day. (Patient taking differently: Inject 4 to 30 units subcutaneously up to 3-4 times a day.) 15 mL 11  . Insulin Pen Needle (PEN NEEDLES) 32G X 4 MM MISC Use for each dose of lantus and humalog injections 100 each 12  . polyethylene glycol powder (GLYCOLAX/MIRALAX) 17 GM/SCOOP powder Take 255 g by mouth daily. 255 g 3  . pregabalin (LYRICA) 25 MG capsule Take 25 mg by mouth 2 (two) times daily.    Marland Kitchen acetaminophen (TYLENOL) 500 MG tablet Take 2 tablets (1,000 mg total) by mouth every 6 (six) hours as needed for mild pain.    . DULoxetine (CYMBALTA) 30 MG capsule Take 1 capsule by mouth twice daily 180 capsule 0  . hydrOXYzine (ATARAX/VISTARIL) 25 MG tablet Take 1 tablet (25 mg total) by mouth every 8 (eight) hours as needed. 90 tablet 4  . levothyroxine (SYNTHROID) 200 MCG tablet TAKE 1 TABLET BY MOUTH ONCE DAILY WITH 25MCG 90 tablet 3  . tiZANidine (ZANAFLEX) 4 MG tablet Take 1 tablet by mouth three times daily as needed 90 tablet 4   No current facility-administered medications for this visit.      Marland Kitchen  PHYSICAL EXAMINATION: ECOG PERFORMANCE STATUS: 0 - Asymptomatic  Vitals:   10/10/20 0919  BP: 97/60  Pulse: 77  Resp: 18  Temp: (!) 97.4 F (36.3 C)  SpO2: 96%   Filed Weights    Physical Exam Constitutional:      Comments: Patient is alone.  Ambulating independently.  HENT:     Head: Normocephalic and atraumatic.     Mouth/Throat:     Pharynx: No oropharyngeal exudate.  Eyes:     Pupils: Pupils are equal, round, and reactive  to light.  Cardiovascular:     Rate and Rhythm: Normal rate and regular rhythm.  Pulmonary:     Effort: Pulmonary effort is normal. No respiratory  distress.     Breath sounds: Normal breath sounds. No wheezing.  Abdominal:     General: Bowel sounds are normal. There is no distension.     Palpations: Abdomen is soft. There is no mass.     Tenderness: There is no abdominal tenderness. There is no guarding or rebound.  Musculoskeletal:        General: No tenderness. Normal range of motion.     Cervical back: Normal range of motion and neck supple.  Skin:    General: Skin is warm.  Neurological:     Mental Status: She is alert and oriented to person, place, and time.  Psychiatric:        Mood and Affect: Affect normal.     LABORATORY DATA:  I have reviewed the data as listed Lab Results  Component Value Date   WBC 11.3 (H) 10/10/2020   HGB 15.3 (H) 10/10/2020   HCT 46.2 (H) 10/10/2020   MCV 88.2 10/10/2020   PLT 315 10/10/2020   Recent Labs    12/04/19 1426 12/08/19 1039 04/03/20 1125 07/09/20 1103 09/16/20 0914 10/10/20 0851 10/17/20 1012  NA  --    < > 135  --  138 135 136  K  --    < > 4.0  --  5.0 4.0 4.6  CL  --    < > 97*  --  96 95* 99  CO2  --    < > 27  --  _0 GLUCOSE  --    < > 294*  --  251* 234* 341*  BUN 8   < > 10  --  18 28* 17  CREATININE 0.60   < > 0.55   < > 0.62 1.25* 0.73  CALCIUM  --    < > 8.9  --  9.5 9.3 8.5*  GFRNONAA 103   < > >60  --   --  51* >60  GFRAA 119  --   --   --   --   --   --   PROT  --    < > 7.9  --  7.1 7.5  --   ALBUMIN  --    < > 3.9  --  4.1 4.2  --   AST  --    < > 27  --  15 22  --   ALT  --    < > 38  --  21 26  --   ALKPHOS  --    < > 124  --  145* 111  --   BILITOT  --    < > 0.4  --  <0.2 0.3  --    < > = values in this interval not displayed.    RADIOGRAPHIC STUDIES: I have personally reviewed the radiological images as listed and agreed with the findings in the report. No results found.  ASSESSMENT & PLAN:   Von Willebrand disease ([place]) # VWD- as reported by pt-extensive review of records [2013-2016]-no excessive bleeding noted post surgical  interventions.  Von Willebrand work-up negative.  Ok to proceed with sbecaeous cyst excison.   # Erythrocytosis- Hb 15/HCT 17; clinically secondary; JAK2-NEGATIVE-suspect secondary.  Question OSA.  Defer to PCP regarding sleep study.  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  No clinical evidence of disease.  Chromogranin A normal.  # Hx of colon cancer [2009]- ? Stage II-CEA normal.  S/p colonoscopy 2021 [Dr.Vanga]-negative.  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  Clinically NO evidence of recurrence.  Thyroid markers- negative.  TSH-0.3; normal T4/T3 [suppressive dose of Synthroid; goal TSH unclear].  Continue Synthroid at the current dose. Defer to endocrinology.   DISPOSITION: # follow up in 6 months- MD; labs- cbc/cmp; thyroid profile/thyroid antibodies/PT-PTT.- Dr.B  Cc: Jolene Cannady/Dr.Vanga  All questions were answered. The patient knows to call the clinic with any problems, questions or concerns.    Cammie Sickle, MD 11/13/2020 1:08 PM

## 2020-10-10 NOTE — Progress Notes (Signed)
Surgical clearance form.  Has a burning sensation across entire torso. Has a stinging sensation like she is getting stung by wasps x3 months.

## 2020-10-10 NOTE — Assessment & Plan Note (Addendum)
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  Von Willebrand work-up negative.  Ok to proceed with sbecaeous cyst excison.   # Erythrocytosis- Hb 15/HCT 17; clinically secondary; JAK2-NEGATIVE-suspect secondary.  Question OSA.  Defer to PCP regarding sleep study.  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  No clinical evidence of disease.  Chromogranin A normal.  # Hx of colon cancer [2009]- ? Stage II-CEA normal.  S/p colonoscopy 2021 [Dr.Vanga]-negative.  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  Clinically NO evidence of recurrence.  Thyroid markers- negative.  TSH-0.3; normal T4/T3 [suppressive dose of Synthroid; goal TSH unclear].  Continue Synthroid at the current dose. Defer to endocrinology.   DISPOSITION: # follow up in 6 months- MD; labs- cbc/cmp; thyroid profile/thyroid antibodies/PT-PTT.- Dr.B  Cc: Jolene Cannady/Dr.Vanga

## 2020-10-11 ENCOUNTER — Telehealth: Payer: Self-pay

## 2020-10-11 LAB — THYROID PANEL WITH TSH
Free Thyroxine Index: 1.8 (ref 1.2–4.9)
T3 Uptake Ratio: 24 % (ref 24–39)
T4, Total: 7.6 ug/dL (ref 4.5–12.0)
TSH: 1.49 u[IU]/mL (ref 0.450–4.500)

## 2020-10-11 LAB — THYROGLOBULIN BY IMA: Thyroglobulin by IMA: <0.1 ng/mL — ABNORMAL LOW (ref 1.5–38.5)

## 2020-10-11 LAB — TGAB+THYROGLOBULIN IMA OR RIA: Thyroglobulin Antibody: <1 IU/mL (ref 0.0–0.9)

## 2020-10-11 NOTE — Telephone Encounter (Signed)
Received Oncology/Hematology clearance from Dr. Rogue Bussing. Pt's risk assessment is low. Per Dr. Rogue Bussing, pt should avoid NSAIDS/Aspirin 7 days prior to surgery. Apply pressure bandage post surgery.  Faxed to [facility]mit.

## 2020-10-14 ENCOUNTER — Other Ambulatory Visit: Payer: Self-pay | Admitting: Nurse Practitioner

## 2020-10-14 ENCOUNTER — Other Ambulatory Visit: Payer: Self-pay

## 2020-10-15 NOTE — Telephone Encounter (Signed)
[place] called and spoke to [place], [facility] about the refill(s) Atenolol, Semglee, and Hydroxyzine requested. Advised when they were sent, but the [facility] is not showing on her profile, pharmacist says that's the same as Lantus. She says they received Atenolol on 09/16/20 and the patient picked up a 90 day supply already, Lantus received on 09/16/20 and is available for refill, Hydroxyzine received today 90/4 refills. All requests will be refused.

## 2020-10-15 NOTE — Telephone Encounter (Signed)
Patient last seen 09/16/20 and has appointment 12/17/20

## 2020-10-15 NOTE — Telephone Encounter (Signed)
Called and left a message for patient letting her know that she should have a 90 day supply at [facility]

## 2020-10-15 NOTE — Telephone Encounter (Signed)
Requested medication (s) are due for refill today - review # requested, expired Rx  Requested medication (s) are on the active medication list - yes  Future visit scheduled -yes  Last refill: hydroxyzine 09/18/20      Duloxetine- 09/01/19 #180 4RF  Notes to clinic: Request RF: check # ordered- different request for #30/#90- 3 times /day- sent for review, Expired Rx- sent for review- visit criteria met  Requested Prescriptions  Pending Prescriptions Disp Refills   hydrOXYzine (ATARAX/VISTARIL) 25 MG tablet [Pharmacy Med Name: hydrOXYzine HCl 25 MG Oral Tablet] 90 tablet 0    Sig: TAKE 1 TABLET BY MOUTH THREE TIMES DAILY     Ear, Nose, and Throat:  Antihistamines Passed - 10/14/2020  2:19 PM      Passed - Valid encounter within last 12 months    Recent Outpatient Visits           4 weeks ago Hypertension associated with diabetes ([place])   [place], Lauren A, NP   1 year ago Depression, recurrent ([place])   [employer], [place] T, NP   1 year ago Encounter to establish care   [facility] [place], Henrine Screws T, NP       Future Appointments             In 2 months Cannady, Barbaraann Faster, NP [employer]             DULoxetine (CYMBALTA) 30 MG capsule [Pharmacy Med Name: DULoxetine HCl 30 MG Oral Capsule Delayed Release Particles] 180 capsule 0    Sig: Take 1 capsule by mouth twice daily     Psychiatry: Antidepressants - SNRI Passed - 10/14/2020  2:19 PM      Passed - Completed PHQ-2 or PHQ-9 in the last 360 days      Passed - Last BP in normal range    BP Readings from Last 1 Encounters:  10/10/20 97/60          Passed - Valid encounter within last 6 months    Recent Outpatient Visits           4 weeks ago Hypertension associated with diabetes ([place])   [place], Lauren A, NP   1 year ago Depression, recurrent ([place])   [place], Preston T, NP   1 year ago  Encounter to establish care   [facility] [place], Barbaraann Faster, NP       Future Appointments             In 2 months Cannady, Barbaraann Faster, NP [employer]               Requested Prescriptions  Pending Prescriptions Disp Refills   hydrOXYzine (ATARAX/VISTARIL) 25 MG tablet [Pharmacy Med Name: hydrOXYzine HCl 25 MG Oral Tablet] 90 tablet 0    Sig: TAKE 1 TABLET BY MOUTH THREE TIMES DAILY     Ear, Nose, and Throat:  Antihistamines Passed - 10/14/2020  2:19 PM      Passed - Valid encounter within last 12 months    Recent Outpatient Visits           4 weeks ago Hypertension associated with diabetes ([place])   [place], Lauren A, NP   1 year ago Depression, recurrent ([place])   [place], [place] T, NP   1 year ago Encounter to establish care   [facility] [place], [place]  T, NP       Future Appointments             In 2 months Cannady, Barbaraann Faster, NP [employer]             DULoxetine (CYMBALTA) 30 MG capsule [Pharmacy Med Name: DULoxetine HCl 30 MG Oral Capsule Delayed Release Particles] 180 capsule 0    Sig: Take 1 capsule by mouth twice daily     Psychiatry: Antidepressants - SNRI Passed - 10/14/2020  2:19 PM      Passed - Completed PHQ-2 or PHQ-9 in the last 360 days      Passed - Last BP in normal range    BP Readings from Last 1 Encounters:  10/10/20 97/60          Passed - Valid encounter within last 6 months    Recent Outpatient Visits           4 weeks ago Hypertension associated with diabetes ([place])   [place], Lauren A, NP   1 year ago Depression, recurrent ([place])   [place], Barbaraann Faster, NP   1 year ago Encounter to establish care   [place], Barbaraann Faster, NP       Future Appointments             In 2 months Cannady, Barbaraann Faster, NP [employer]

## 2020-10-16 MED ORDER — FAMOTIDINE 20 MG PO TABS
20.0000 mg | ORAL_TABLET | Freq: Once | ORAL | Status: AC
  Administered 2020-10-17: 20 mg via ORAL

## 2020-10-16 MED ORDER — BUPIVACAINE LIPOSOME 1.3 % IJ SUSP: 20.0000 mL | Freq: Once | INTRAMUSCULAR | Status: DC

## 2020-10-16 MED ORDER — CHLORHEXIDINE GLUCONATE CLOTH 2 % EX PADS: 6.0000 | MEDICATED_PAD | Freq: Once | CUTANEOUS | Status: DC

## 2020-10-16 MED ORDER — ORAL CARE MOUTH RINSE: 15.0000 mL | Freq: Once | OROMUCOSAL | Status: AC

## 2020-10-16 MED ORDER — CEFAZOLIN SODIUM-DEXTROSE 2-4 GM/100ML-% IV SOLN
2.0000 g | INTRAVENOUS | Status: AC
  Administered 2020-10-17: 2 g via INTRAVENOUS

## 2020-10-16 MED ORDER — CHLORHEXIDINE GLUCONATE CLOTH 2 % EX PADS
6.0000 | MEDICATED_PAD | Freq: Once | CUTANEOUS | Status: AC
  Administered 2020-10-17: 6 via TOPICAL

## 2020-10-16 MED ORDER — SODIUM CHLORIDE 0.9 % IV SOLN: INTRAVENOUS | Status: DC

## 2020-10-16 MED ORDER — CHLORHEXIDINE GLUCONATE 0.12 % MT SOLN
15.0000 mL | Freq: Once | OROMUCOSAL | Status: AC
  Administered 2020-10-17: 15 mL via OROMUCOSAL

## 2020-10-16 MED ORDER — ACETAMINOPHEN 500 MG PO TABS
1000.0000 mg | ORAL_TABLET | ORAL | Status: AC
  Administered 2020-10-17: 1000 mg via ORAL

## 2020-10-16 NOTE — Anesthesia Preprocedure Evaluation (Addendum)
Anesthesia Evaluation  Patient identified by MRN, date of birth, ID band Patient awake    Reviewed: Allergy & Precautions, NPO status , Patient's Chart, lab work & pertinent test results  History of Anesthesia Complications Negative for: history of anesthetic complications  Airway Mallampati: III  TM Distance: >3 FB Neck ROM: Full    Dental no notable dental hx. (+) Teeth Intact, Poor Dentition, Missing,    Pulmonary asthma , sleep apnea , COPD,  COPD inhaler, Current Smoker and Patient abstained from smoking.,  Used to be on home O2, but no longer. Prescribed CPAP but can't tolerate it   Pulmonary exam normal breath sounds clear to auscultation       Cardiovascular Exercise Tolerance: Good METShypertension, Pt. on medications (-) CAD and (-) Past MI (-) dysrhythmias  Rhythm:Regular Rate:Normal - Systolic murmurs    Neuro/Psych Seizures -,  PSYCHIATRIC DISORDERS Anxiety Depression MS, thinks she may be having a partial MS flare (has an "MS hug", a band around her belly of allodynia and hypersensitivity), also has difficulty swallowing.  Neuromuscular disease    GI/Hepatic GERD  ,(+)     (-) substance abuse  ,   Endo/Other  diabetesHypothyroidism   Renal/GU negative Renal ROS     Musculoskeletal  (+) Arthritis , Osteoarthritis,    Abdominal   Peds  Hematology  (+) Blood dyscrasia, , Questionable prior history of Von Willebrand Disease, patient states she has had bleeding after prior surgeries, however recent hematology note says extensive review of records showed no significant bleeding complications and that her lab results were not consistent with Von Willebrand Disease   Anesthesia Other Findings Past Medical History: No date: Anxiety No date: Arthritis No date: Asthma No date: Cancer ([place]) No date: Clotting disorder ([place]) No date: COPD (chronic obstructive pulmonary disease) (HCC) No date: Depression No  date: Diabetes mellitus without complication (HCC) No date: Family history of leukemia No date: Family history of lymphoma No date: Family history of skin cancer No date: Family history of thyroid cancer No date: GERD (gastroesophageal reflux disease) No date: History of esophageal cancer No date: Hyperlipidemia No date: Hypertension No date: Neuromuscular disorder ([place]) No date: Oxygen deficiency No date: Seizures ([place]) No date: Sleep apnea No date: Thyroid disease No date: Von Willebrand disease, type I ([place]) MS  Reproductive/Obstetrics                            Anesthesia Physical  Anesthesia Plan  ASA: 3  Anesthesia Plan: General   Post-op Pain Management:    Induction: Intravenous  PONV Risk Score and Plan: 2 and Ondansetron, Dexamethasone, Midazolam and Treatment may vary due to age or medical condition  Airway Management Planned: Oral ETT  Additional Equipment: None  Intra-op Plan:   Post-operative Plan: Extubation in OR  Informed Consent: I have reviewed the patients History and Physical, chart, labs and discussed the procedure including the risks, benefits and alternatives for the proposed anesthesia with the patient or authorized representative who has indicated his/her understanding and acceptance.     Dental advisory given  Plan Discussed with: CRNA and Surgeon  Anesthesia Plan Comments: (Discussed risks of anesthesia with patient, including PONV, sore throat, lip/dental damage, exacerbation of MS flare. Rare risks discussed as well, such as cardiorespiratory and neurological sequelae, and allergic reactions. Patient understands.)        Anesthesia Quick Evaluation

## 2020-10-17 ENCOUNTER — Ambulatory Visit: Payer: Medicaid Other | Admitting: Anesthesiology

## 2020-10-17 ENCOUNTER — Encounter: Payer: Self-pay | Admitting: Surgery

## 2020-10-17 ENCOUNTER — Ambulatory Visit
Admission: RE | Admit: 2020-10-17 | Discharge: 2020-10-17 | Disposition: A | Discharge status: Home / Self Care | Payer: Medicaid Other | Attending: Surgery | Admitting: Surgery

## 2020-10-17 ENCOUNTER — Encounter
Admission: RE | Disposition: A | Discharge status: Home / Self Care | Payer: Self-pay | Source: Home / Self Care | Attending: Surgery

## 2020-10-17 DIAGNOSIS — Z91040 Latex allergy status: Secondary | ICD-10-CM | POA: Insufficient documentation

## 2020-10-17 DIAGNOSIS — Z79890 Hormone replacement therapy (postmenopausal): Secondary | ICD-10-CM | POA: Diagnosis not present

## 2020-10-17 DIAGNOSIS — Z807 Family history of other malignant neoplasms of lymphoid, hematopoietic and related tissues: Secondary | ICD-10-CM | POA: Diagnosis not present

## 2020-10-17 DIAGNOSIS — Z85850 Personal history of malignant neoplasm of thyroid: Secondary | ICD-10-CM | POA: Diagnosis not present

## 2020-10-17 DIAGNOSIS — L723 Sebaceous cyst: Secondary | ICD-10-CM | POA: Diagnosis present

## 2020-10-17 DIAGNOSIS — Z888 Allergy status to other drugs, medicaments and biological substances status: Secondary | ICD-10-CM | POA: Insufficient documentation

## 2020-10-17 DIAGNOSIS — Z806 Family history of leukemia: Secondary | ICD-10-CM | POA: Diagnosis not present

## 2020-10-17 DIAGNOSIS — Z85038 Personal history of other malignant neoplasm of large intestine: Secondary | ICD-10-CM | POA: Diagnosis not present

## 2020-10-17 DIAGNOSIS — Z8249 Family history of ischemic heart disease and other diseases of the circulatory system: Secondary | ICD-10-CM | POA: Insufficient documentation

## 2020-10-17 DIAGNOSIS — L720 Epidermal cyst: Secondary | ICD-10-CM | POA: Diagnosis not present

## 2020-10-17 DIAGNOSIS — E1165 Type 2 diabetes mellitus with hyperglycemia: Secondary | ICD-10-CM

## 2020-10-17 DIAGNOSIS — F17210 Nicotine dependence, cigarettes, uncomplicated: Secondary | ICD-10-CM | POA: Insufficient documentation

## 2020-10-17 DIAGNOSIS — E119 Type 2 diabetes mellitus without complications: Secondary | ICD-10-CM | POA: Diagnosis not present

## 2020-10-17 DIAGNOSIS — Z79899 Other long term (current) drug therapy: Secondary | ICD-10-CM | POA: Insufficient documentation

## 2020-10-17 DIAGNOSIS — Z801 Family history of malignant neoplasm of trachea, bronchus and lung: Secondary | ICD-10-CM | POA: Diagnosis not present

## 2020-10-17 DIAGNOSIS — Z808 Family history of malignant neoplasm of other organs or systems: Secondary | ICD-10-CM | POA: Insufficient documentation

## 2020-10-17 DIAGNOSIS — D680 Von Willebrand's disease: Secondary | ICD-10-CM | POA: Diagnosis not present

## 2020-10-17 DIAGNOSIS — E039 Hypothyroidism, unspecified: Secondary | ICD-10-CM | POA: Diagnosis present

## 2020-10-17 DIAGNOSIS — G35 Multiple sclerosis: Secondary | ICD-10-CM | POA: Diagnosis not present

## 2020-10-17 DIAGNOSIS — J449 Chronic obstructive pulmonary disease, unspecified: Secondary | ICD-10-CM | POA: Diagnosis not present

## 2020-10-17 DIAGNOSIS — Z800 Family history of malignant neoplasm of digestive organs: Secondary | ICD-10-CM | POA: Insufficient documentation

## 2020-10-17 DIAGNOSIS — Z885 Allergy status to narcotic agent status: Secondary | ICD-10-CM | POA: Diagnosis not present

## 2020-10-17 DIAGNOSIS — Z8371 Family history of colonic polyps: Secondary | ICD-10-CM | POA: Insufficient documentation

## 2020-10-17 DIAGNOSIS — Z887 Allergy status to serum and vaccine status: Secondary | ICD-10-CM | POA: Diagnosis not present

## 2020-10-17 DIAGNOSIS — Z7951 Long term (current) use of inhaled steroids: Secondary | ICD-10-CM | POA: Diagnosis not present

## 2020-10-17 DIAGNOSIS — Z794 Long term (current) use of insulin: Secondary | ICD-10-CM | POA: Diagnosis not present

## 2020-10-17 HISTORY — PX: EXCISION OF BACK LESION: SHX6597

## 2020-10-17 LAB — BASIC METABOLIC PANEL WITH GFR
Anion gap: 8 (ref 5–15)
BUN: 17 mg/dL (ref 6–20)
CO2: 29 mmol/L (ref 22–32)
Calcium: 8.5 mg/dL — ABNORMAL LOW (ref 8.9–10.3)
Chloride: 99 mmol/L (ref 98–111)
Creatinine, Ser: 0.73 mg/dL (ref 0.44–1.00)
GFR, Estimated: >60 mL/min
Glucose, Bld: 341 mg/dL — ABNORMAL HIGH (ref 70–99)
Potassium: 4.6 mmol/L (ref 3.5–5.1)
Sodium: 136 mmol/L (ref 135–145)

## 2020-10-17 LAB — URINE DRUG SCREEN, QUALITATIVE (ARMC ONLY)
Amphetamines, Ur Screen: NOT DETECTED
Barbiturates, Ur Screen: NOT DETECTED
Benzodiazepine, Ur Scrn: POSITIVE — AB
Cannabinoid 50 Ng, Ur ~~LOC~~: POSITIVE — AB
Cocaine Metabolite,Ur ~~LOC~~: NOT DETECTED
MDMA (Ecstasy)Ur Screen: NOT DETECTED
Methadone Scn, Ur: NOT DETECTED
Opiate, Ur Screen: NOT DETECTED
Phencyclidine (PCP) Ur S: NOT DETECTED
Tricyclic, Ur Screen: POSITIVE — AB

## 2020-10-17 LAB — GLUCOSE, CAPILLARY
Glucose-Capillary: 292 mg/dL — ABNORMAL HIGH (ref 70–99)
Glucose-Capillary: 305 mg/dL — ABNORMAL HIGH (ref 70–99)
Glucose-Capillary: 392 mg/dL — ABNORMAL HIGH (ref 70–99)
Glucose-Capillary: 355 mg/dL — ABNORMAL HIGH (ref 70–99)
Glucose-Capillary: 312 mg/dL — ABNORMAL HIGH (ref 70–99)
Glucose-Capillary: 239 mg/dL — ABNORMAL HIGH (ref 70–99)
Glucose-Capillary: 297 mg/dL — ABNORMAL HIGH (ref 70–99)
Glucose-Capillary: 250 mg/dL — ABNORMAL HIGH (ref 70–99)

## 2020-10-17 LAB — ABO/RH: ABO/RH(D): O POS

## 2020-10-17 SURGERY — EXCISION, LESION, BACK
Anesthesia: General

## 2020-10-17 MED ORDER — FENTANYL CITRATE (PF) 100 MCG/2ML IJ SOLN
INTRAMUSCULAR | Status: AC
  Administered 2020-10-17: 25 ug via INTRAVENOUS
  Filled 2020-10-17: qty 2

## 2020-10-17 MED ORDER — DEXAMETHASONE SODIUM PHOSPHATE 10 MG/ML IJ SOLN
INTRAMUSCULAR | Status: DC | PRN
  Administered 2020-10-17: 10 mg via INTRAVENOUS

## 2020-10-17 MED ORDER — CEFAZOLIN SODIUM-DEXTROSE 2-4 GM/100ML-% IV SOLN
INTRAVENOUS | Status: AC
  Filled 2020-10-17: qty 100

## 2020-10-17 MED ORDER — LIDOCAINE HCL (PF) 2 % IJ SOLN
INTRAMUSCULAR | Status: AC
  Filled 2020-10-17: qty 5

## 2020-10-17 MED ORDER — OXYCODONE HCL 5 MG PO TABS
ORAL_TABLET | ORAL | Status: AC
  Administered 2020-10-17: 5 mg via ORAL
  Filled 2020-10-17: qty 1

## 2020-10-17 MED ORDER — INSULIN ASPART 100 UNIT/ML IJ SOLN
20.0000 [IU] | Freq: Once | INTRAMUSCULAR | Status: AC
  Administered 2020-10-17: 20 [IU] via SUBCUTANEOUS

## 2020-10-17 MED ORDER — SUCCINYLCHOLINE CHLORIDE 200 MG/10ML IV SOSY
PREFILLED_SYRINGE | INTRAVENOUS | Status: DC | PRN
  Administered 2020-10-17: 120 mg via INTRAVENOUS

## 2020-10-17 MED ORDER — INSULIN ASPART 100 UNIT/ML IJ SOLN
INTRAMUSCULAR | Status: AC
  Filled 2020-10-17: qty 1

## 2020-10-17 MED ORDER — ROCURONIUM BROMIDE 100 MG/10ML IV SOLN
INTRAVENOUS | Status: DC | PRN
  Administered 2020-10-17: 35 mg via INTRAVENOUS
  Administered 2020-10-17: 5 mg via INTRAVENOUS

## 2020-10-17 MED ORDER — ONDANSETRON HCL 4 MG/2ML IJ SOLN
INTRAMUSCULAR | Status: AC
  Filled 2020-10-17: qty 2

## 2020-10-17 MED ORDER — ONDANSETRON HCL 4 MG/2ML IJ SOLN
INTRAMUSCULAR | Status: DC | PRN
  Administered 2020-10-17: 4 mg via INTRAVENOUS

## 2020-10-17 MED ORDER — 0.9 % SODIUM CHLORIDE (POUR BTL) OPTIME
TOPICAL | Status: DC | PRN
  Administered 2020-10-17: 100 mL

## 2020-10-17 MED ORDER — CHLORHEXIDINE GLUCONATE 0.12 % MT SOLN
OROMUCOSAL | Status: AC
  Filled 2020-10-17: qty 15

## 2020-10-17 MED ORDER — FAMOTIDINE 20 MG PO TABS
ORAL_TABLET | ORAL | Status: AC
  Filled 2020-10-17: qty 1

## 2020-10-17 MED ORDER — PROPOFOL 10 MG/ML IV BOLUS
INTRAVENOUS | Status: AC
  Filled 2020-10-17: qty 20

## 2020-10-17 MED ORDER — PROPOFOL 10 MG/ML IV BOLUS
INTRAVENOUS | Status: DC | PRN
  Administered 2020-10-17: 150 mg via INTRAVENOUS

## 2020-10-17 MED ORDER — MIDAZOLAM HCL 2 MG/2ML IJ SOLN
INTRAMUSCULAR | Status: AC
  Filled 2020-10-17: qty 2

## 2020-10-17 MED ORDER — BUPIVACAINE HCL (PF) 0.5 % IJ SOLN
INTRAMUSCULAR | Status: AC
  Filled 2020-10-17: qty 30

## 2020-10-17 MED ORDER — BUPIVACAINE-EPINEPHRINE 0.5% -1:200000 IJ SOLN
INTRAMUSCULAR | Status: DC | PRN
  Administered 2020-10-17: 30 mL

## 2020-10-17 MED ORDER — INSULIN ASPART 100 UNIT/ML IJ SOLN: 16.0000 [IU] | Freq: Once | INTRAMUSCULAR | Status: AC

## 2020-10-17 MED ORDER — ROCURONIUM BROMIDE 10 MG/ML (PF) SYRINGE
PREFILLED_SYRINGE | INTRAVENOUS | Status: AC
  Filled 2020-10-17: qty 10

## 2020-10-17 MED ORDER — FENTANYL CITRATE (PF) 100 MCG/2ML IJ SOLN
25.0000 ug | INTRAMUSCULAR | Status: AC | PRN
  Administered 2020-10-17 (×4): 25 ug via INTRAVENOUS

## 2020-10-17 MED ORDER — OXYCODONE HCL 5 MG PO TABS: 5.0000 mg | ORAL_TABLET | Freq: Once | ORAL | Status: AC

## 2020-10-17 MED ORDER — SODIUM CHLORIDE 0.9 % IV SOLN: INTRAVENOUS | Status: DC

## 2020-10-17 MED ORDER — INSULIN ASPART 100 UNIT/ML IJ SOLN
INTRAMUSCULAR | Status: AC
  Administered 2020-10-17: 16 [IU] via SUBCUTANEOUS
  Filled 2020-10-17: qty 1

## 2020-10-17 MED ORDER — MIDAZOLAM HCL 2 MG/2ML IJ SOLN
INTRAMUSCULAR | Status: DC | PRN
  Administered 2020-10-17: 2 mg via INTRAVENOUS

## 2020-10-17 MED ORDER — BUPIVACAINE LIPOSOME 1.3 % IJ SUSP
INTRAMUSCULAR | Status: AC
  Filled 2020-10-17: qty 20

## 2020-10-17 MED ORDER — FENTANYL CITRATE (PF) 100 MCG/2ML IJ SOLN
INTRAMUSCULAR | Status: DC | PRN
  Administered 2020-10-17 (×2): 50 ug via INTRAVENOUS

## 2020-10-17 MED ORDER — SODIUM CHLORIDE 0.9 % IV BOLUS
500.0000 mL | Freq: Once | INTRAVENOUS | Status: AC
  Administered 2020-10-17: 500 mL via INTRAVENOUS

## 2020-10-17 MED ORDER — BUPIVACAINE-EPINEPHRINE (PF) 0.5% -1:200000 IJ SOLN
INTRAMUSCULAR | Status: AC
  Filled 2020-10-17: qty 30

## 2020-10-17 MED ORDER — INSULIN ASPART 100 UNIT/ML IJ SOLN: 6.0000 [IU] | Freq: Once | INTRAMUSCULAR | Status: AC

## 2020-10-17 MED ORDER — OXYCODONE HCL 5 MG PO TABS
ORAL_TABLET | ORAL | Status: AC
  Filled 2020-10-17: qty 1

## 2020-10-17 MED ORDER — FENTANYL CITRATE (PF) 100 MCG/2ML IJ SOLN
INTRAMUSCULAR | Status: AC
  Filled 2020-10-17: qty 2

## 2020-10-17 MED ORDER — LIDOCAINE HCL (CARDIAC) PF 100 MG/5ML IV SOSY
PREFILLED_SYRINGE | INTRAVENOUS | Status: DC | PRN
  Administered 2020-10-17: 50 mg via INTRAVENOUS

## 2020-10-17 MED ORDER — SUCCINYLCHOLINE CHLORIDE 200 MG/10ML IV SOSY
PREFILLED_SYRINGE | INTRAVENOUS | Status: AC
  Filled 2020-10-17: qty 10

## 2020-10-17 MED ORDER — ONDANSETRON HCL 4 MG/2ML IJ SOLN: 4.0000 mg | Freq: Once | INTRAMUSCULAR | Status: DC | PRN

## 2020-10-17 MED ORDER — PHENYLEPHRINE HCL (PRESSORS) 10 MG/ML IV SOLN
INTRAVENOUS | Status: DC | PRN
  Administered 2020-10-17 (×2): 100 ug via INTRAVENOUS

## 2020-10-17 MED ORDER — OXYCODONE HCL 5 MG PO TABS: 5.0000 mg | ORAL_TABLET | ORAL | 0 refills | Status: DC | PRN

## 2020-10-17 MED ORDER — INSULIN ASPART 100 UNIT/ML IJ SOLN
INTRAMUSCULAR | Status: AC
  Administered 2020-10-17: 6 [IU] via SUBCUTANEOUS
  Filled 2020-10-17: qty 1

## 2020-10-17 MED ORDER — ACETAMINOPHEN 500 MG PO TABS
ORAL_TABLET | ORAL | Status: AC
  Filled 2020-10-17: qty 2

## 2020-10-17 MED ORDER — OXYCODONE HCL 5 MG PO TABS
5.0000 mg | ORAL_TABLET | Freq: Once | ORAL | Status: AC
  Administered 2020-10-17: 5 mg via ORAL

## 2020-10-17 MED ORDER — DEXMEDETOMIDINE HCL IN NACL 200 MCG/50ML IV SOLN
INTRAVENOUS | Status: DC | PRN
  Administered 2020-10-17 (×2): 12 ug via INTRAVENOUS

## 2020-10-17 MED ORDER — DEXAMETHASONE SODIUM PHOSPHATE 10 MG/ML IJ SOLN
INTRAMUSCULAR | Status: AC
  Filled 2020-10-17: qty 1

## 2020-10-17 MED ORDER — ACETAMINOPHEN 500 MG PO TABS: 1000.0000 mg | ORAL_TABLET | Freq: Four times a day (QID) | ORAL | Status: AC | PRN

## 2020-10-17 MED ORDER — SUGAMMADEX SODIUM 200 MG/2ML IV SOLN
INTRAVENOUS | Status: DC | PRN
  Administered 2020-10-17 (×2): 100 mg via INTRAVENOUS

## 2020-10-17 SURGICAL SUPPLY — 36 items
ADH SKN CLS APL DERMABOND .7 (GAUZE/BANDAGES/DRESSINGS) ×1
APL PRP STRL LF DISP 70% ISPRP (MISCELLANEOUS) ×1
CHLORAPREP W/TINT 26 (MISCELLANEOUS) ×2 IMPLANT
DERMABOND ADVANCED (GAUZE/BANDAGES/DRESSINGS) ×1
DERMABOND ADVANCED .7 DNX12 (GAUZE/BANDAGES/DRESSINGS) ×1 IMPLANT
DRAPE 3/4 80X56 (DRAPES) ×2 IMPLANT
DRAPE LAPAROTOMY 100X77 ABD (DRAPES) ×2 IMPLANT
ELECT CAUTERY BLADE TIP 2.5 (TIP) ×2
ELECT REM PT RETURN 9FT ADLT (ELECTROSURGICAL) ×2
ELECTRODE CAUTERY BLDE TIP 2.5 (TIP) ×1 IMPLANT
ELECTRODE REM PT RTRN 9FT ADLT (ELECTROSURGICAL) ×1 IMPLANT
GAUZE 4X4 16PLY ~~LOC~~+RFID DBL (SPONGE) ×2 IMPLANT
GLOVE SURG SYN 7.0 (GLOVE) ×6 IMPLANT
GLOVE SURG SYN 7.0 PF PI (GLOVE) ×1 IMPLANT
GLOVE SURG SYN 7.5  E (GLOVE) ×3
GLOVE SURG SYN 7.5 E (GLOVE) ×3 IMPLANT
GLOVE SURG SYN 7.5 PF PI (GLOVE) ×1 IMPLANT
GOWN STRL REUS W/ TWL LRG LVL3 (GOWN DISPOSABLE) ×2 IMPLANT
GOWN STRL REUS W/TWL LRG LVL3 (GOWN DISPOSABLE) ×6
IV SODIUM CHL 0.9% 500ML (IV SOLUTION) ×1 IMPLANT
KIT TURNOVER KIT A (KITS) ×2 IMPLANT
LABEL OR SOLS (LABEL) ×2 IMPLANT
MANIFOLD NEPTUNE II (INSTRUMENTS) ×2 IMPLANT
NEEDLE HYPO 22GX1.5 SAFETY (NEEDLE) ×2 IMPLANT
NS IRRIG 1000ML POUR BTL (IV SOLUTION) ×2 IMPLANT
PACK BASIN MINOR ARMC (MISCELLANEOUS) ×2 IMPLANT
SUT MNCRL 4-0 (SUTURE) ×2
SUT MNCRL 4-0 27XMFL (SUTURE) ×1
SUT VIC AB 0 SH 27 (SUTURE) ×2 IMPLANT
SUT VIC AB 2-0 SH 27 (SUTURE) ×2
SUT VIC AB 2-0 SH 27XBRD (SUTURE) IMPLANT
SUT VIC AB 3-0 SH 27 (SUTURE) ×2
SUT VIC AB 3-0 SH 27X BRD (SUTURE) ×2 IMPLANT
SUTURE MNCRL 4-0 27XMF (SUTURE) ×1 IMPLANT
SYR 30ML LL (SYRINGE) ×2 IMPLANT
WATER STERILE IRR 500ML POUR (IV SOLUTION) ×1 IMPLANT

## 2020-10-17 NOTE — Interval H&P Note (Signed)
History and Physical Interval Note:  10/17/2020 7:04 AM  Margaret Church  has presented today for surgery, with the diagnosis of sebaceous cyst back.  The various methods of treatment have been discussed with the patient and family. After consideration of risks, benefits and other options for treatment, the patient has consented to  Procedure(s): EXCISION OF BACK LESION (N/A) as a surgical intervention.  The patient's history has been reviewed, patient examined, no change in status, stable for surgery.  I have reviewed the patient's chart and labs.  Questions were answered to the patient's satisfaction.     Atwell Mcdanel

## 2020-10-17 NOTE — Anesthesia Procedure Notes (Signed)
Procedure Name: Intubation Date/Time: 10/17/2020 7:42 AM Performed by: Jonna Clark, CRNA Pre-anesthesia Checklist: Patient identified, Patient being monitored, Timeout performed, Emergency Drugs available and Suction available Patient Re-evaluated:Patient Re-evaluated prior to induction Oxygen Delivery Method: Circle system utilized Preoxygenation: Pre-oxygenation with 100% oxygen Induction Type: IV induction Ventilation: Mask ventilation without difficulty Laryngoscope Size: Miller and 2 Grade View: Grade I Tube type: Oral Tube size: 7.0 mm Number of attempts: 1 Airway Equipment and Method: Stylet Placement Confirmation: ETT inserted through vocal cords under direct vision, positive ETCO2 and breath sounds checked- equal and bilateral Secured at: 22 cm Tube secured with: Tape Dental Injury: Teeth and Oropharynx as per pre-operative assessment

## 2020-10-17 NOTE — Discharge Instructions (Signed)

## 2020-10-17 NOTE — Consult Note (Addendum)
[facility] Medical Consultation  Margaret Church PYK:[id number] DOB: Mar 07, 1965 DOA: 10/17/2020 PCP: Venita Lick, NP   Requesting physician: Dr Bertell Maria Date of consultation: 10/17/20 Reason for consultation: Hyperglycemia  Impression/Recommendations Principal Problem:   Diabetes mellitus with hyperglycemia ([place]) Active Problems:   Hypothyroidism   Chronic obstructive pulmonary disease ([place])   Sebaceous cyst    Diabetes mellitus with hyperglycemia Medical consult requested for persistent hyperglycemia despite multiple doses of NovoLog administered postoperatively. Patient is status post excision of a sebaceous cyst and was found to have hyperglycemia.  Per anesthesia she received a total of 42 units of NovoLog but blood sugar remained elevated, over 300g/dl. Patient states that her blood sugars have been uncontrolled for a long time.  She is on 50 units of Lantus at bedtime but took 25 units last night because she was going to be n.p.o. for planned procedure. Last blood sugar was 299.   Continue aggressive IV fluid hydration with normal saline and repeat blood sugars in an hour. Will hold off on further insulin administration for now   I will followup again tomorrow. Please contact me if I can be of assistance in the meanwhile. Thank you for this consultation.  Chief Complaint: Uncontrolled blood sugar  HPI: Patient is a 55 year old female who presented to [facility] for an outpatient procedure which consisted of excision of a sebaceous cyst on her back.  Medical consult was requested for evaluation of hyperglycemia. Patient's blood sugar was in the 300s and she received a total of 42 units of NovoLog between 8:56 AM and 10:04 AM. Medical consult was requested for persistent hyperglycemia Patient is on Lantus 50 units at bedtime but only took 25 units because she was n.p.o. for planned procedure in a.m. She denies having any chest pain, no shortness of breath, no fever, no  chills, no cough, no abdominal pain, no changes in her bowel habits, no urinary frequency, no nocturia, no dysuria, no headache, no blurred vision or any focal deficit.  Review of Systems:   As per HPI otherwise all other systems reviewed and negative.     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)    Oxygen deficiency    Seizures ([place])    Sleep apnea    Thyroid cancer ([place]) 2014   Thyroid disease    Von Willebrand disease, type I ([place])    Past Surgical History:  Procedure Laterality Date   ABDOMINAL HYSTERECTOMY     APPENDECTOMY     COLON SURGERY     COLONOSCOPY WITH PROPOFOL N/A 12/21/2019   Procedure: COLONOSCOPY WITH PROPOFOL;  Surgeon: Lin Landsman, MD;  Location: [facility] ENDOSCOPY;  Service: Gastroenterology;  Laterality: N/A;   ESOPHAGOGASTRODUODENOSCOPY (EGD) WITH PROPOFOL N/A 12/21/2019   Procedure: ESOPHAGOGASTRODUODENOSCOPY (EGD) WITH PROPOFOL;  Surgeon: Lin Landsman, MD;  Location[facility]COPY;  Service: Gastroenterology;  Laterality: N/A;  HERNIA REPAIR     KNEE SURGERY     tonsils surgery     Social History:  reports that she has been smoking cigarettes. She has a 2.00 pack-year smoking history. She has never used smokeless tobacco. She reports that she does not currently use drugs after having used the following drugs: Marijuana. She reports that she does not drink alcohol.  Allergies  Allergen Reactions   Latex     Other reaction(s): Hive    Tetanus Antitoxin     Other reaction(s): Arm Swelling, Fever, N/V   Gabapentin Rash    Other reaction(s): Pins and Needles Sensation on head   Other Other (See Comments), Rash and Swelling    SURGICAL STAPLES CAUSE LOCAL SWELLING   Tramadol Rash   Family History  Problem Relation Age of Onset   Lung cancer Mother 46   Cancer Mother        skin cancer;    Heart disease Mother    Alzheimer's disease Father    Cancer Father        skin cancer   Hypertension Father    Hip fracture Brother    Hypertension Brother    Skin cancer Brother    Alcohol abuse Brother    Hypertension Brother    Other Brother        large tumor on thigh removed in [place]   Colon polyps Daughter 28   Skin cancer Maternal Aunt    Skin cancer Maternal Aunt    Von Willebrand disease Paternal Aunt        and carcinoid disease   Esophageal cancer Paternal Aunt    Multiple myeloma Paternal Uncle    Cancer Paternal Uncle        unk type   Liver cancer Paternal Uncle    Lymphoma Paternal Grandmother    Thyroid cancer Other     Prior to Admission medications   Medication Sig Start Date End Date Taking? Authorizing Provider  acetaminophen (TYLENOL) 500 MG tablet Take 2 tablets (1,000 mg total) by mouth every 6 (six) hours as needed for mild pain. 10/17/20  Yes Piscoya, Jacqulyn Bath, MD  albuterol (PROVENTIL) (2.5 MG/3ML) 0.083% nebulizer solution Take 3 mLs (2.5 mg total) by nebulization every 6 (six) hours as needed for wheezing or shortness of breath. 12/20/19  Yes Tyler Pita, MD  albuterol (VENTOLIN HFA) 108 (90 Base) MCG/ACT inhaler Inhale 2 puffs into the lungs every 6 (six) hours as needed for wheezing or shortness of breath. 09/01/19  Yes Cannady, Jolene T, NP  AMITIZA 8 MCG capsule Take 8 mcg by mouth 2 (two) times daily. 09/11/20  Yes [provider]  amLODipine (NORVASC) 5 MG tablet Take 1 tablet (5 mg total) by mouth daily. 09/16/20  Yes McElwee, Lauren A, NP  amphetamine-dextroamphetamine (ADDERALL)  12.5 MG tablet Take 12.5 mg by mouth 2 (two) times daily.   Yes [provider]  atenolol (TENORMIN) 50 MG tablet Take 1 tablet (50 mg total) by mouth daily. 09/16/20  Yes McElwee, Lauren A, NP  baclofen (LIORESAL) 20 MG tablet Take 20 mg by mouth 2 (two) times daily.   Yes [provider]  carbamazepine (TEGRETOL) 200 MG tablet Take 100 mg by mouth 3 (three) times daily. 08/18/20  Yes [provider]  diazepam (VALIUM) 5 MG tablet Take 5 mg by mouth every 6 (six) hours as needed for anxiety.   Yes [provider]  DULoxetine (CYMBALTA) 30 MG capsule Take 1 capsule by  mouth twice daily 10/15/20  Yes Cannady, Jolene T, NP  ezetimibe (ZETIA) 10 MG tablet Take 1 tablet (10 mg total) by mouth daily. 09/16/20  Yes McElwee, Lauren A, NP  Fluticasone-Salmeterol (ADVAIR) 250-50 MCG/DOSE AEPB Inhale 1 puff into the lungs daily. 12/11/19  Yes Cannady, Jolene T, NP  hydrOXYzine (ATARAX/VISTARIL) 25 MG tablet Take 1 tablet (25 mg total) by mouth every 8 (eight) hours as needed. 10/15/20  Yes Cannady, Jolene T, NP  insulin glargine (LANTUS) 100 UNIT/ML Solostar Pen Inject 50 Units into the skin at bedtime. 48-50 units at bedtime 09/16/20  Yes McElwee, Lauren A, NP  insulin lispro (HUMALOG KWIKPEN) 100 UNIT/ML KwikPen Inject 4 to 18 units subcutaneously up to 3-4 times a day. Patient taking differently: Inject 4 to 30 units subcutaneously up to 3-4 times a day. 09/16/20  Yes McElwee, Lauren A, NP  levothyroxine (SYNTHROID) 200 MCG tablet Take 1 tablet (200 mcg total) by mouth daily. With 25 mcg. (total 225 mcg daily) 09/05/20  Yes Cannady, Jolene T, NP  oxyCODONE (OXY IR/ROXICODONE) 5 MG immediate release tablet Take 1 tablet (5 mg total) by mouth every 4 (four) hours as needed for severe pain. 10/17/20  Yes Piscoya, Jose, MD  polyethylene glycol powder (GLYCOLAX/MIRALAX) 17 GM/SCOOP powder Take 255 g by mouth daily. 12/04/19  Yes Vanga, Tally Due, MD  pregabalin (LYRICA) 25 MG capsule Take  25 mg by mouth 2 (two) times daily. 09/02/20  Yes [provider]  tiZANidine (ZANAFLEX) 4 MG tablet Take 1 tablet by mouth three times daily as needed 10/15/20  Yes Cannady, Jolene T, NP  ACCU-CHEK GUIDE test strip USE TO TEST BLOOD SUGAR FOUR TIMES DAILY 11/26/19   Cannady, Jolene T, NP  Accu-Chek Softclix Lancets lancets USE TO TEST BLOOD SUGAR FOUR TIMES DAILY 10/31/19   Cannady, Henrine Screws T, NP  Blood Glucose Monitoring Suppl (ACCU-CHEK GUIDE ME) w/Device KIT USE TO TEST BLOOD SUGAR FOUR TIMES DAILY 10/31/19   Cannady, Jolene T, NP  DULCOLAX 10 MG suppository Place 10 mg rectally daily as needed. 06/10/20   [provider]  Insulin Pen Needle (PEN NEEDLES) 32G X 4 MM MISC Use for each dose of lantus and humalog injections 09/16/20   McElwee, Lauren A, NP   Physical Exam: Blood pressure 117/71, pulse 65, temperature 97.9 F (36.6 C), resp. rate 12, height '5\' 6"'  (1.676 m), weight 94.3 kg, SpO2 95 %. Vitals:   10/17/20 1115 10/17/20 1130  BP: 122/80 117/71  Pulse: 71 65  Resp: 13 12  Temp:    SpO2: 94% 95%    General: Lying in bed, appears comfortable and in no distress Eyes: Pale conjunctiva ENT: Within normal limits Neck: Supple, no JVD Cardiovascular: RRR S1,S2 Respiratory: Clear to auscultation bilaterally Abdomen: Bowel sounds present, soft, nontender, central adiposity Skin: Warm and dry Musculoskeletal: Generalized weakness Psychiatric: Normal mood and affect Neurologic: Able to move all extremities  Labs on Admission:  Basic Metabolic Panel: Recent Labs  Lab 10/17/20 1012  NA 136  K 4.6  CL 99  CO2 29  GLUCOSE 341*  BUN 17  CREATININE 0.73  CALCIUM 8.5*   Liver Function Tests: No results for input(s): AST, ALT, ALKPHOS, BILITOT, PROT, ALBUMIN in the last 168 hours. No results for input(s): LIPASE, AMYLASE in the last 168 hours. No results for input(s): AMMONIA in the last 168 hours. CBC: No results for input(s): WBC, NEUTROABS, HGB, HCT, MCV, PLT  in the last 168 hours. Cardiac Enzymes: No results for input(s):  CKTOTAL, CKMB, CKMBINDEX, TROPONINI in the last 168 hours. BNP: Invalid input(s): POCBNP CBG: Recent Labs  Lab 10/17/20 0845 10/17/20 0915 10/17/20 0954 10/17/20 1033 10/17/20 1134  GLUCAP 305* 392* 355* 312* 297*    Radiological Exams on Admission: No results found.  EKG: Independently reviewed.  Normal sinus rhythm  Time spent: 55 minutes  Daylyn Christine [facility] Pager [phone]  If 7PM-7AM, please contact night-coverage [email or web address] Password [facility] 10/17/2020, 12:03 PM

## 2020-10-17 NOTE — Progress Notes (Addendum)
Inpatient Diabetes Program Recommendations  AACE/ADA: New Consensus Statement on Inpatient Glycemic Control (2015)  Target Ranges:  Prepandial:   less than 140 mg/dL      Peak postprandial:   less than 180 mg/dL (1-2 hours)      Critically ill patients:  140 - 180 mg/dL   Lab Results  Component Value Date   GLUCAP 312 (H) 10/17/2020   HGBA1C 10.0 (H) 09/16/2020    Review of Glycemic Control Results for Margaret Church, Margaret Church (MRN [id number]) as of 10/17/2020 11:16  Ref. Range 10/17/2020 08:45 10/17/2020 09:15 10/17/2020 09:54 10/17/2020 10:33  Glucose-Capillary Latest Ref Range: 70 - 99 mg/dL 305 (H) 392 (H) 355 (H) 312 (H)   Diabetes history: DM2 Outpatient Diabetes medications: Lantus 48-50 units q hs, Humalog 4-30 units tid meal coverage 3-4 times daily Current orders for Inpatient glycemic control: In PACU  Inpatient Diabetes Program Recommendations:   Spoke with PACU RN Mabeline Caras Apple to clarify if patient was on IV insulin in addition to subcutaneous coverage. Patient received Novolog 6 units 856 am                                           16 units 923 am                                            20 units 1004 am Recheck CBG to assure does not have hypoglycemia. Patient had appointment with NP Internal Medicine 09/16/20 and NP reported patient had decrease in A1c of 12.9 to 10.1 on 09/16/20.  Will follow patient during hospitalization.  Thank you, Nani Gasser. Adisyn Ruscitti, RN, MSN, CDE  Diabetes Coordinator Inpatient Glycemic Control Team Team Pager [phone] (8am-5pm) 10/17/2020 11:25 AM

## 2020-10-17 NOTE — Progress Notes (Signed)
Pt states pain 7/10. Dr. Bertell Maria notified. Acknowledged. Orders received. See MAR.

## 2020-10-17 NOTE — Op Note (Signed)
  Procedure Date:  10/17/2020  Pre-operative Diagnosis:  Back sebaceous cyst  Post-operative Diagnosis: Back sebaceous cyst  Procedure:  Excision of back sebaceous cyst; layered closure of 3 cm.  Surgeon:  Melvyn Neth, MD  Anesthesia:  General endotracheal  Estimated Blood Loss:  2 ml  Specimens:  back sebaceous cyst  Complications:  None  Indications for Procedure:  This is a 55 y.o. female with diagnosis of previously infected back sebaceous cyst. The patient wishes to have this excised. The risks of bleeding, abscess or infection, injury to surrounding structures, and need for further procedures were all discussed with the patient and she was willing to proceed.  Description of Procedure: The patient was correctly identified in the preoperative area and brought into the operating room.  The patient was placed supine with VTE prophylaxis in place.  Appropriate time-outs were performed.  Anesthesia was induced and the patient was intubated.  Appropriate antibiotics were infused.  The patient was then placed in prone position.  The patient's upper back was prepped and draped in usual sterile fashion.  The sebaceous cyst measured about 2 cm.  An elliptical 3 cm incision was made over the cyst, incorporating the skin entry pore, and cautery was used to dissect down the dermis and subcutaneous tissue to the cyst itself.  Skin flaps were created using cautery as well, and then the cyst was excised using cautery, intact.  It was sent off to pathology.  The cavity was then irrigated and hemostasis was assured with cautery.  Local anesthetic was infused intradermally.  The wound was then closed in three layers using 2-0 Vicryl, 3-0 Vicryl, and 4-0 Monocryl.  The incision was cleaned and sealed with DermaBond.  The patient was then placed back on supine position.  The patient was then emerged from anesthesia, extubated, and brought to the recovery room for further management.    The patient  tolerated the procedure well and all counts were correct at the end of the case.   Melvyn Neth, MD

## 2020-10-17 NOTE — Transfer of Care (Signed)
Immediate Anesthesia Transfer of Care Note  Patient: Margaret Church  Procedure(s) Performed: EXCISION OF BACK LESION  Patient Location: PACU  Anesthesia Type:General  Level of Consciousness: drowsy and patient cooperative  Airway & Oxygen Therapy: Patient Spontanous Breathing and Patient connected to face mask oxygen  Post-op Assessment: Report given to RN and Post -op Vital signs reviewed and stable  Post vital signs: Reviewed and stable  Last Vitals:  Vitals Value Taken Time  BP 113/59 10/17/20 0837  Temp 36.6 C 10/17/20 0837  Pulse 72 10/17/20 0840  Resp 16 10/17/20 0840  SpO2 96 % 10/17/20 0840  Vitals shown include unvalidated device data.  Last Pain:  Vitals:   10/17/20 0837  TempSrc:   PainSc: Asleep         Complications: No notable events documented.

## 2020-10-17 NOTE — Anesthesia Postprocedure Evaluation (Signed)
Anesthesia Post Note  Patient: Margaret Church  Procedure(s) Performed: EXCISION OF BACK LESION  Patient location during evaluation: PACU Anesthesia Type: General Level of consciousness: awake and alert Pain management: pain level controlled Vital Signs Assessment: post-procedure vital signs reviewed and stable Respiratory status: spontaneous breathing, nonlabored ventilation, respiratory function stable and patient connected to nasal cannula oxygen Cardiovascular status: blood pressure returned to baseline and stable Postop Assessment: no apparent nausea or vomiting Anesthetic complications: no Comments: Refractory hyperglycemia post op. S/p 42 units of subq novolog and fluid boluses. Finally down to glucose in the 250s; consulted medicine who deemed this to be sufficient control and stable enough for discharge with resumption of her home diabetes meds.   No notable events documented.   Last Vitals:  Vitals:   10/17/20 1300 10/17/20 1312  BP: 117/72 110/61  Pulse: 73 73  Resp: 13 16  Temp: (!) 36.3 C (!) 36.3 C  SpO2: 94% 96%    Last Pain:  Vitals:   10/17/20 1312  TempSrc: Temporal  PainSc: 7                  Arita Miss

## 2020-10-17 NOTE — Progress Notes (Signed)
Pt CBG now 250. Dr. Francine Graven and Dr. Bertell Maria notified. Both stated pt could now go home.

## 2020-10-18 LAB — SURGICAL PATHOLOGY

## 2020-10-22 NOTE — Progress Notes (Signed)
Today the history is gathered from:  80% - patient   20% - Patient's husband    RECORDS SUMMARY:  No new records on file.     REFERRING PHYSICIAN: [facility] Dr. Harvest Dark  PRIMARY CARE PHYSICIAN:  [facility]    IMPRESSION/PLAN   Kristin Williamson is a 55 y.o. female presenting for evaluation of    MULTIPLE SCLEROSIS /DIFFICULTY WALKING  - Worse  - Patient with current MS flare.   - Will send a referral to endocrinology for diabetes management,   - Start Lyrica 25 mg once a day for week 1, then increase to 25 mg in the morning and 25 mg at night for week 2, then increase to 25 mg in the morning and 50 mg at night for week 3, then increase to 50 mg twice daily for week 4, then increase to 50 mg in the morning and 75 mg at night for week 5, then increase to 75 mg twice daily and continue this dose.   - Will order MS lab panels today   - Start ocrelizumab   - Will ask Mr. 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 Patient reports no new seizure medication  - Will continue to monitor.       RESTLESS LEGS/ SLEEP DIFFICULTY / FATIGUE   - Ongoing.   - Patient with restless legs, stiffness, and fatigue. Having leg cramping.   - Start Lyrica, as stated above.         MEDICATIONS PREVIOUSLY TRIED  Gabapentin- rash.     Follow up with Dr. Malvin Johns in 2-3 months    p=4    CHIEF COMPLAINT & HPI   Kristin Williamson is a 55 y.o. female presenting for evaluation of:  Chief Complaint   Patient presents with   . Multiple Sclerosis   . Difficulty Walking   . history of TBI   . Seizures   . restless leg syndrome   . Insomnia   . Fatigue      MULTIPLE SCLEROSIS/  09/16/20  Glucose 292  A1c = 10  Patient has had the MS hugs for the past 3 months. Patient had surgery last week to remove a cyst on  her back and this has made the MS flare up worse, reports that her fatigue is worse, her chest hurts to touch, reports sweating all day, and wanting to sleep all day. Patient was given multiple antibiotics and during surgery and then her blood sugar spiked to 400 in the hospital and they were able to get blood sugar down to 292 at time of discharge. Patient is unable to walk today, in a wheelchair. Patient crying in pain and does not usually cry during pain. Patient had MRI in April 2022 but results are not accessible via care everywhere.  Patient states that hospital gave her 42 units of insulin and was curious if this is standard. Patient states she has been having difficulty with her memory since her MS flare. Patients blood sugar yesterday was 297.  Patient has not been able to get in touch with endocrinology and would like another referral. Patient experiencing dysphasia and reports this is happening more frequently.  HISTORY OF TBI/ SEIZURE  Patient has not had an seizure like activity that she knows of. Reports 1 month ago her pupils were different sizes and did not go to the eye doctor for this.  Reports with MS here pupils occasionally change sizes.       RESTLESS LEGS/ SLEEP DIFFICULTY  Patient states restless legs are not great, endorses cramping and tightening feeling. States bilaterally in the feet her toes will do a jumping motion and she does not realize this is happening. Patient reports sleeping almost all day long with this new ms flare. Patient allergic to gabapentin but was taking Lyrica and was curious about starting this again.       DATA SUMMARY:  12/04/2019  EXTENDED EEG  IMPRESSION:  This extended in-office continuously monitored video EEG in the awake and asleep states is within normal limits.    10/20/2019  ROUTINE EEG  IMPRESSION:  This routine EEG in the awake and drowsy states is within normal limits.    Brain MRI 11/11/2019  IMPRESSION:   Supratentorial white matter signal  abnormalities, compatible with   multiple sclerosis.     No evidence of active demyelination.              VISIT SUMMARIES:  10/22/20 - Will send a referral to endocrinology for diabetes management,  Start Lyrica 25 mg once a day for week 1, then increase to 25 mg in the morning and 25 mg at night for week 2, then increase to 25 mg in the morning and 50 mg at night for week 3, then increase to 50 mg twice daily for week 4, then increase to 50 mg in the morning and 75 mg at night for week 5, then increase to 75 mg twice daily and continue this dose.  Will order MS lab panels today , Start ocrelizumab, Will ask Mr. 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     05/29/2020: Symptoms ongoing. Will send urgent referral to GI for constipation with significant pain. Discussed that if symptoms get worse, she should go to the ER.  Start taking Lyrica 25 mg twice daily for 1 week, then increase to 50 mg twice daily.  Continue taking carbamazepine 100 mg three times daily.  Continue taking diazepam 5 mg twice daily as needed, refilled.  Continue taking Adderall 12.5 mg twice daily, refilled.  Will send a prescription for electric wheelchair and shower chair.    02/21/2020: Symptoms ongoing. Continue taking Adderall 12.5 mg twice daily, refilled.  Continue taing diazepam 5 mg twice daily as needed, refilled. Will send a referral to dietician to help manage diabetes.    11/20/2019: Symptoms ongoing. Continue Adderall 12.5 mg twice daily, refilled. Continue taking diazepam 5 mg twice daily as needed, refilled. We will order a 3 hour EEG to evaluate for seizures.     10/11/19: Patient with MS, history of TBI, seizure, restless legs, and sleep difficulty, new to me. Decrease Topamax by 50 mg every two weeks until discontinued. Continue Adderall 12.5 mg twice daily and   Valium 5 mg twice daily. Schedule brain MRI without contrast to evaluate for MS lesions. Send referral to [facility]  Endocrinology to establish care. Send referral for home health assessment. Schedule routine EEG to evaluate for seizure-like activity.     MEDICATIONS  Outpatient Medications Marked as Taking for the 10/22/20 encounter (Office Visit) with Alphonzo Lemmings, MD   Medication Sig   .  albuterol 90 mcg/actuation inhaler Inhale 2 inhalations into the lungs   . amLODIPine (NORVASC) 5 MG tablet Take 5 mg by mouth once daily   . atenoloL (TENORMIN) 50 MG tablet Take 50 mg by mouth nightly   . baclofen (LIORESAL) 20 MG tablet Take 20 mg by mouth 2 (two) times daily   . bisacodyL (DULCOLAX) 10 mg suppository Place 1 suppository (10 mg total) rectally once daily as needed for Constipation   . carBAMazepine (TEGRETOL) 200 mg tablet Take 100 mg three times a day   . dextroamphetamine-amphetamine (ADDERALL) 12.5 MG tablet Take 1 tablet (12.5 mg total) by mouth 2 (two) times daily   . diazePAM (VALIUM) 5 MG tablet Take 1 tablet (5 mg total) by mouth 2 (two) times daily as needed   . DULoxetine (CYMBALTA) 30 MG DR capsule Take 30 mg by mouth 2 (two) times daily   . ezetimibe-rosuvastatin 10-20 mg Tab Take by mouth once daily   . fluticasone propion-salmeteroL (ADVAIR DISKUS) 250-50 mcg/dose diskus inhaler Inhale 1 inhalation into the lungs once daily   . hydrOXYzine (ATARAX) 25 MG tablet Take 25 mg by mouth 3 (three) times daily as needed for Itching   . insulin lispro 100 unit/mL inph Inject subcutaneously 4-18 units three to four times daily   . ipratropium-albuteroL (DUO-NEB) nebulizer solution Take 3 mLs by nebulization once daily as needed for Wheezing   . LANTUS SOLOSTAR U-100 INSULIN pen injector (concentration 100 units/mL) Inject subcutaneously nightly   . levothyroxine (SYNTHROID) 200 MCG tablet Take 200 mcg by mouth once daily Take on an empty stomach with a glass of water at least 30-60 minutes before breakfast.   . lubiprostone (AMITIZA) 8 MCG capsule Take 1 capsule (8 mcg total) by mouth 2 (two) times daily with meals    . tiZANidine (ZANAFLEX) 4 MG tablet Take 4 mg by mouth 3 (three) times daily as needed       ALLERGIES  Allergies   Allergen Reactions   . Dpt-Haemophilus Ps(Tet.Conj.) Rash   . Gabapentin Dizziness   . Latex, Natural Rubber Swelling   . Other Rash     SURGICAL STAPLES   . Tramadol Itching         EXAM     There were no vitals filed for this visit.  There is no height or weight on file to calculate BMI.       GENERAL:  Pleasant female, in no apparent distress today Normocephalic and atraumatic.    MUSCULOSKELETAL:  Bulk - Obese  Tone - Normal  Pronator Drift - Absent bilaterally.  Ambulation - Gait and station is severely unsteady with the physician, requires 1 assist the entire time.  However, it is noted that patient does not come in with an assistive walking device such as cane or walker and is able to walk in and walk out of the visit without assistance.       PAST MEDICAL HISTORY  Past Medical History:   Diagnosis Date   . Carcinoid syndrome (CMS-HCC)    . COPD (chronic obstructive pulmonary disease) (CMS-HCC)    . Diabetes mellitus without complication (CMS-HCC)    . History of cancer    . Multiple sclerosis (CMS-HCC)    . Seizures (CMS-HCC)    . Von Willebrand disease (CMS-HCC)        PAST SURGICAL HISTORY  Past Surgical History:   Procedure Laterality Date   . APPENDECTOMY     . COLONOSCOPY     .  history of endoscopy     . HYSTERECTOMY VAGINAL     . THYROIDECTOMY TOTAL     . TONSILLECTOMY Bilateral        FAMILY HISTORY  Family History   Problem Relation Age of Onset   . Multiple sclerosis Mother    . Cancer Mother    . Parkinsonism Mother    . High blood pressure (Hypertension) Mother    . Depression Mother    . Alzheimer's disease Father    . Cancer Father    . Diabetes Father    . Diabetes Daughter    . Diabetes Maternal Grandmother        SOCIAL HISTORY   Social History     Tobacco Use   . Smoking status: Current Every Day Smoker     Types: Cigarettes   . Smokeless tobacco: Never Used   Substance Use  Topics   . Alcohol use: Not Currently         REVIEW OF SYSTEMS:   13 system ROS form was given to the patient to complete and I have reviewed it.  The form was sent for scan to the patient's EHR.  Pertinent positives and negatives are mentioned above in the HPI and all other systems are negative.      DATA   I have personally reviewed all of the data outlined below both prior to the appointment and during the appointment with the patient as appropriate.    No visits with results within 6 Month(s) from this visit.   Latest known visit with results is:   No results found for any previous visit.     11/11/2019  BRAIN MRI W AND WO     EXAM:   MRI HEAD WITHOUT CONTRAST     TECHNIQUE:   Multiplanar, multiecho pulse sequences of the brain and surrounding   structures were obtained without intravenous contrast.     COMPARISON: None.     FINDINGS:   Brain: No diffusion-weighted signal abnormality. No intracranial   hemorrhage. Scattered T2/FLAIR hyperintense foci involving the   periventricular and juxtacortical white matter.     Cerebral volume is within normal limits. Normal appearance of the   corpus callosum. No midline shift, ventriculomegaly or extra-axial   fluid collection. No mass lesion.     Vascular: Normal flow voids. Right occipital developmental venous   anomaly.     Skull and upper cervical spine: Normal marrow signal.     Sinuses/Orbits: Normal orbits. Mild ethmoid sinus mucosal   thickening. No mastoid effusion.     Other: None.     IMPRESSION:   Supratentorial white matter signal abnormalities, compatible with   multiple sclerosis.     No evidence of active demyelination.       Electronically Signed   By: Stana Bunting M.D.   On: 11/11/2019 20:24        No follow-ups on file.    Payor: BCBS Scientist, physiological / Plan: [place] [employer] HEALTHY BLUE / Product Type: Medicaid /       This note is partially prepared by Serita Grammes, scribe, in the presence of and acting as the scribe of Dr. Theora Master, who has  reviewed, edited and added to the note to reflect his best personal medical judgment.    I have reviewed, edited and added to the note as needed to reflect my best personal medical judgment.        Dr. Theora Master, MD  [facility]  [facility]  [place], [place]  Ph:  [phone]  Fax:  [phone]

## 2020-10-23 ENCOUNTER — Other Ambulatory Visit: Payer: Medicaid Other | Admitting: Student

## 2020-10-23 ENCOUNTER — Telehealth: Payer: Self-pay | Admitting: Student

## 2020-10-23 ENCOUNTER — Other Ambulatory Visit: Payer: Self-pay

## 2020-10-23 NOTE — Telephone Encounter (Signed)
Palliative NP left messages on both phone numbers listed to confirm visit for today and screening. Awaiting response.

## 2020-10-27 ENCOUNTER — Other Ambulatory Visit: Payer: Self-pay | Admitting: Nurse Practitioner

## 2020-10-27 NOTE — Telephone Encounter (Signed)
Both meds requested RF too soon Both meds were filled same day and same amount: last RF 09/16/20 #90 4 RF

## 2020-11-01 ENCOUNTER — Ambulatory Visit (INDEPENDENT_AMBULATORY_CARE_PROVIDER_SITE_OTHER): Payer: Medicaid Other | Admitting: Surgery

## 2020-11-01 ENCOUNTER — Encounter: Payer: Self-pay | Admitting: Surgery

## 2020-11-01 ENCOUNTER — Other Ambulatory Visit: Payer: Self-pay

## 2020-11-01 VITALS — BP 158/90 | HR 81 | Temp 97.8°F | Ht 66.0 in

## 2020-11-01 DIAGNOSIS — L723 Sebaceous cyst: Secondary | ICD-10-CM

## 2020-11-01 NOTE — Patient Instructions (Signed)
No lifting until wound heals.   If you have any concerns or questions, please feel free to call our office. Follow up as needed.    Excision of Skin Lesions, Care After This sheet gives you information about how to care for yourself after your procedure. Your health care provider may also give you more specific instructions. If you have problems or questions, contact your health care provider. What can I expect after the procedure? After your procedure, it is common to have pain or discomfort at the excision site. Follow these instructions at home: Excision care  Follow instructions from your health care provider about how to take care of your excision site. Make sure you: Wash your hands with soap and water before and after you change your bandage (dressing). If soap and water are not available, use hand sanitizer. Change your dressing as told by your health care provider. Leave stitches (sutures), skin glue, or adhesive strips in place. These skin closures may need to stay in place for 2 weeks or longer. If adhesive strip edges start to loosen and curl up, you may trim the loose edges. Do not remove adhesive strips completely unless your health care provider tells you to do that. Check the excision area every day for signs of infection. Watch for: Redness, swelling, or pain. Fluid or blood. Warmth. Pus or a bad smell. Keep the site clean, dry, and protected for at least 48 hours. For bleeding, apply gentle but firm pressure to the area using a folded towel for 20 minutes. Avoid high-impact exercise and activities until the sutures are removed or the area heals. General instructions Take over-the-counter and prescription medicines only as told by your health care provider. Follow instructions from your health care provider about how to minimize scarring. Scarring should lessen over time. Avoid sun exposure until the area has healed. Use sunscreen to protect the area from the sun after it  has healed. Keep all follow-up visits as told by your health care provider. This is important. Contact a health care provider if: You have redness, swelling, or pain around your excision site. You have fluid or blood coming from your excision site. Your excision site feels warm to the touch. You have pus or a bad smell coming from your excision site. You have a fever. You have pain that does not improve in 2-3 days after your procedure. You notice skin irregularities or changes in how you feel (sensation). Summary This sheet of instructions provides you with information about caring for yourself after your procedure. Contact your health care provider if you have any problems or questions. Take over-the-counter and prescription medicines only as told by your health care provider. Change your dressing as told by your health care provider. Contact a health care provider if you have redness, swelling, pain, or other signs of infection around your excision site. Keep all follow-up visits as told by your health care provider. This is important. This information is not intended to replace advice given to you by your health care provider. Make sure you discuss any questions you have with your health care provider. Document Revised: 08/11/2017 Document Reviewed: 08/11/2017 Elsevier Patient Education  Revloc.

## 2020-11-01 NOTE — Progress Notes (Signed)
11/01/2020  HPI: Margaret Church is a 55 y.o. female s/p excision of right upper back sebaceous cyst on 10/17/2020.  Patient presents today for follow-up.  She reports that she is doing very well.  She only reports having some mild drainage from the incision initially but then this has stopped already.  Denies any significant pain.  She does report having some side effects with her multiple sclerosis with initial significant weakness but this has been improving lately.  Vital signs: BP (!) 158/90   Pulse 81   Temp 97.8 F (36.6 C) (Oral)   Ht '5\' 6"'$  (1.676 m)   SpO2 94%   BMI 33.57 kg/m    Physical Exam: Constitutional: No acute distress Skin: Right upper back incision at this point appears to be healing well with a scab over the incision.  There is no active drainage at this point and no evidence of any infection or purulence.  Assessment/Plan: This is a 55 y.o. female s/p excision of right upper back sebaceous cyst.  - Reviewed the pathology results again with the patient showing a sebaceous cyst. - Patient is healing well and despite of a small amount of drainage from the wound, it appears to be healing well now. - Follow-up as needed.   Melvyn Neth, [facility]

## 2020-11-12 ENCOUNTER — Other Ambulatory Visit: Payer: Self-pay | Admitting: Nurse Practitioner

## 2020-11-26 ENCOUNTER — Other Ambulatory Visit: Payer: Self-pay | Admitting: Nurse Practitioner

## 2020-11-27 NOTE — Telephone Encounter (Signed)
Requested medications are due for refill today.  yes  Requested medications are on the active medications list.  yes  Last refill. 10/08/2020  Future visit scheduled.   yes  Notes to clinic.  Historical medication. Medication not delegated.

## 2020-11-27 NOTE — Telephone Encounter (Signed)
Requested Prescriptions  Pending Prescriptions Disp Refills  . ezetimibe (ZETIA) 10 MG tablet [Pharmacy Med Name: Ezetimibe 10 MG Oral Tablet] 90 tablet 0    Sig: Take 1 tablet by mouth once daily     Cardiovascular:  Antilipid - Sterol Transport Inhibitors Failed - 11/26/2020  2:51 PM      Failed - Total Cholesterol in normal range and within 360 days    Cholesterol, Total  Date Value Ref Range Status  09/16/2020 297 (H) 100 - 199 mg/dL Final         Failed - LDL in normal range and within 360 days    LDL Chol Calc (NIH)  Date Value Ref Range Status  09/16/2020 170 (H) 0 - 99 mg/dL Final         Failed - HDL in normal range and within 360 days    HDL  Date Value Ref Range Status  09/16/2020 37 (L) >39 mg/dL Final         Failed - Triglycerides in normal range and within 360 days    Triglycerides  Date Value Ref Range Status  09/16/2020 454 (H) 0 - 149 mg/dL Final         Passed - Valid encounter within last 12 months    Recent Outpatient Visits          2 months ago Hypertension associated with diabetes ([place])   [place], Lauren A, NP   1 year ago Depression, recurrent ([place])   [place], Jolene T, NP   1 year ago Encounter to establish care   [place], Barbaraann Faster, NP      Future Appointments            In 2 weeks [place], Barbaraann Faster, NP [employer]

## 2020-12-13 ENCOUNTER — Other Ambulatory Visit: Payer: Self-pay

## 2020-12-17 ENCOUNTER — Ambulatory Visit: Payer: Medicaid Other | Admitting: Nurse Practitioner

## 2021-03-31 ENCOUNTER — Telehealth: Payer: Self-pay

## 2021-03-31 NOTE — Telephone Encounter (Signed)
930 am.  Phone call made to patient to complete a check in.  No answer.  Message left requesting a call back.  Attempted to reach life partner but number has been disconnected.

## 2021-04-10 ENCOUNTER — Inpatient Hospital Stay: Payer: Medicaid Other | Attending: Internal Medicine

## 2021-04-10 ENCOUNTER — Inpatient Hospital Stay: Payer: Medicaid Other | Admitting: Internal Medicine

## 2021-04-15 ENCOUNTER — Ambulatory Visit (INDEPENDENT_AMBULATORY_CARE_PROVIDER_SITE_OTHER): Payer: Self-pay | Admitting: Neurology

## 2021-07-16 IMAGING — MR MR 3D RECON AT [facility]ANNER
22 series · 22 of 22 positions shown · IV contrast (9ml Gadavist)
Comparison: None.

CLINICAL DATA: Recurrent abdominal pain for 2 years.

EXAM:
MRI ABDOMEN WITHOUT AND WITH CONTRAST (INCLUDING MRCP)
TECHNIQUE: Multiplanar multisequence MR imaging of the abdomen was performed
both before and after the administration of intravenous contrast.
Heavily T2-weighted images of the biliary and pancreatic ducts were
obtained, and three-dimensional MRCP images were rendered by post
processing.
CONTRAST:  9mL GADAVIST GADOBUTROL 1 MMOL/ML IV SOLN

[Series 3: T2 · coronal · 6.0mm · 1.19mm/px · 1 of 40 slices shown (1 of 2)]
[im 1/40]
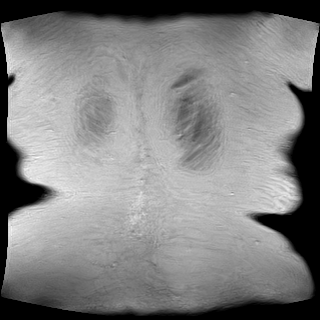

[Series 4: T2 · axial · 6.0mm · 1.25mm/px · 1 of 40 slices shown (2 of 2)]
[im 1/40]
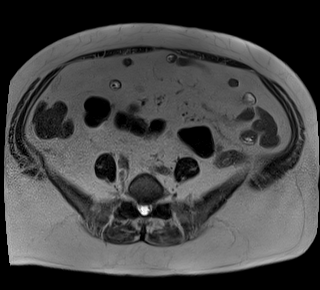

[Series 7: T2 fat-sat · axial · 6.0mm · 1.25mm/px · 1 of 40 slices shown]
[im 1/40]
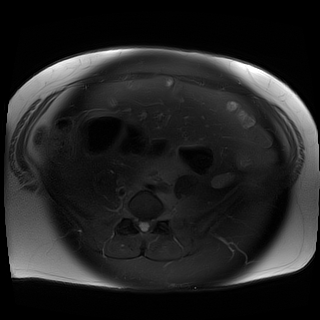

[Series 8: T1 · axial · 6.0mm · 0.78mm/px · 1 of 40 slices shown (1 of 2)]
[im 1/40]
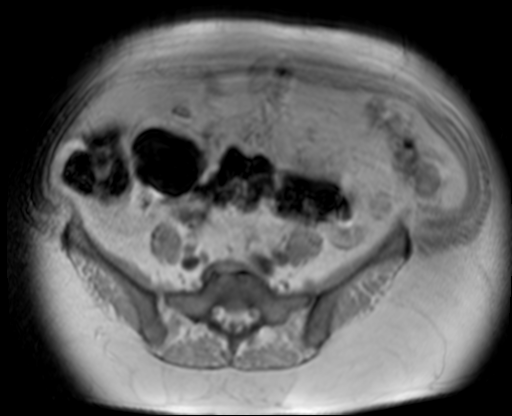

[Series 8: T1 · axial · 6.0mm · 0.78mm/px · 1 of 40 slices shown (2 of 2)]
[im 1/40]
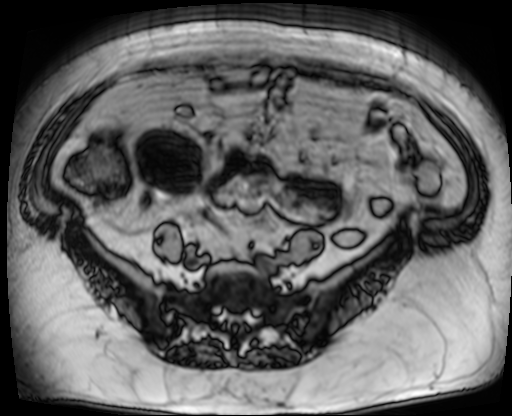

[Series 10: MRCP · coronal · 1.0mm · 0.49mm/px · 1 of 80 slices shown (1 of 2)]
[im 1/80]
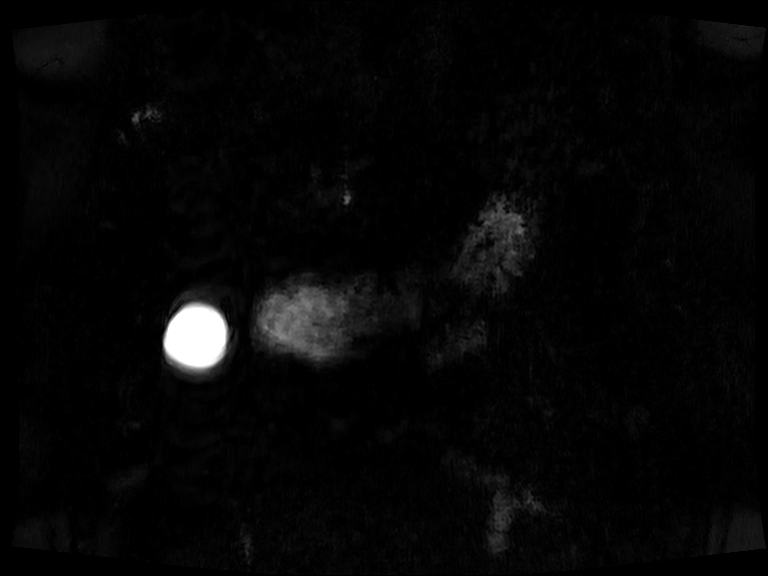

[Series 12: ax dwi_tracew · axial · 6.0mm · 1.42mm/px · 1 of 120 slices shown]
[im 1/120]
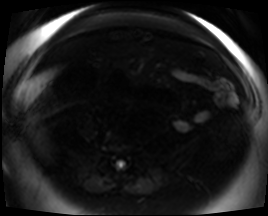

[Series 13: ax dwi_adc · axial · 6.0mm · 1.42mm/px · 1 of 40 slices shown]
[im 1/40]
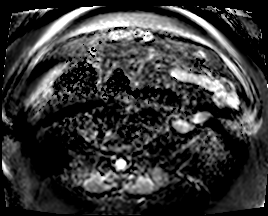

[Series 14: MRCP · coronal · 3.0mm · 1.12mm/px · 1 of 17 slices shown (2 of 2)]
[im 1/17]
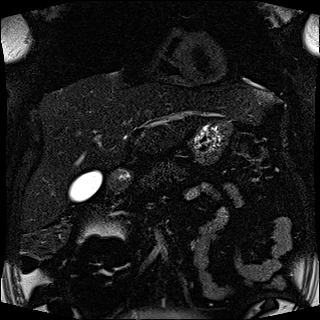

[Series 15: radials · coronal · 50.0mm · 0.78mm/px · 1 of 5 slices shown]
[im 1/5]
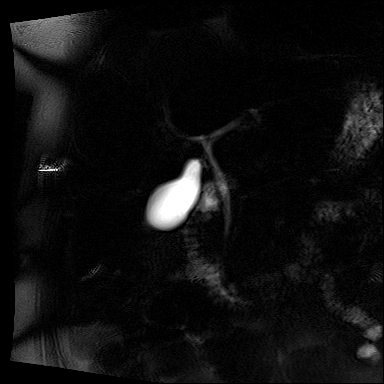

[Series 16: T1 dynamic fat-sat · axial · non-contrast · 3.0mm · 1.25mm/px · 1 of 88 slices shown (1 of 5)]
[im 1/88]
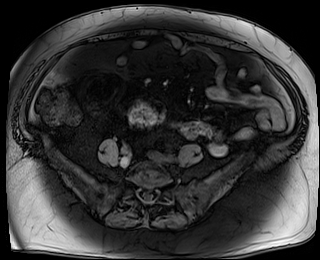

[Series 17: T1 dynamic fat-sat post-contrast · axial · 3.0mm · 1.25mm/px · 1 of 88 slices shown (1 of 4)]
[im 1/88]
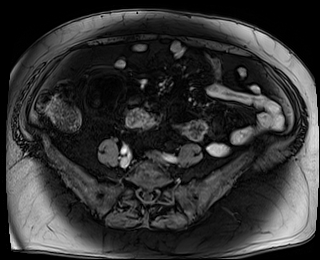

[Series 18: T1 dynamic fat-sat · axial · 3.0mm · 1.25mm/px · 1 of 88 slices shown (2 of 5)]
[im 1/88]
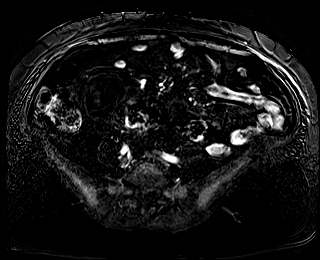

[Series 19: T1 dynamic fat-sat post-contrast · axial · 3.0mm · 1.25mm/px · 1 of 88 slices shown (2 of 4)]
[im 1/88]
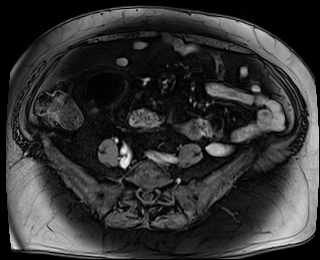

[Series 20: T1 dynamic fat-sat · axial · 3.0mm · 1.25mm/px · 1 of 88 slices shown (3 of 5)]
[im 1/88]
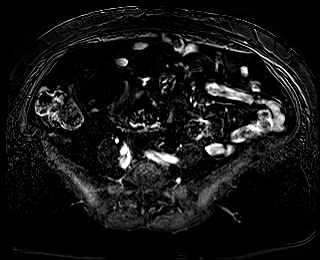

[Series 21: T1 dynamic fat-sat post-contrast · axial · 3.0mm · 1.25mm/px · 1 of 88 slices shown (3 of 4)]
[im 1/88]
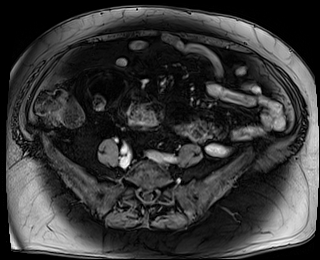

[Series 22: T1 dynamic fat-sat · axial · 3.0mm · 1.25mm/px · 1 of 88 slices shown (4 of 5)]
[im 1/88]
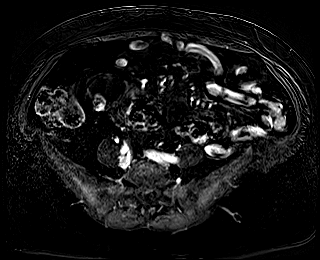

[Series 23: T1 dynamic post-contrast · coronal · 3.0mm · 1.31mm/px · 1 of 80 slices shown]
[im 1/80]
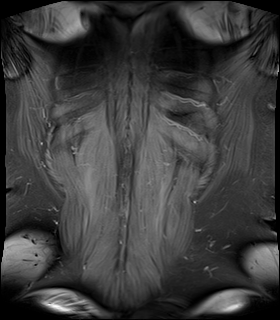

[Series 24: T1 dynamic fat-sat post-contrast · axial · 3.0mm · 1.25mm/px · 1 of 88 slices shown (4 of 4)]
[im 1/88]
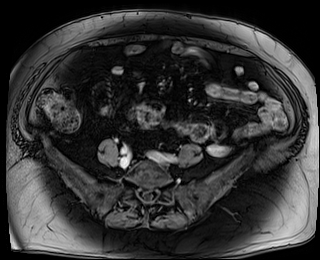

[Series 25: T1 dynamic fat-sat · axial · 3.0mm · 1.25mm/px · 1 of 88 slices shown (5 of 5)]
[im 1/88]
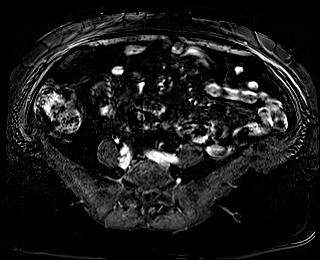

[Series 1019: mip · oblique · 0.5mm · 0.47mm/px · 1 of 6 slices shown]
[im 1/6]
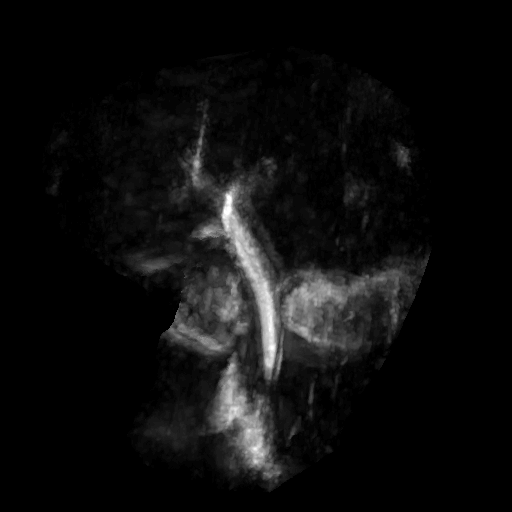

[Series 1026: tumble cow · axial · 0.5mm · 0.47mm/px · 1 of 7 slices shown]
[im 1/7]
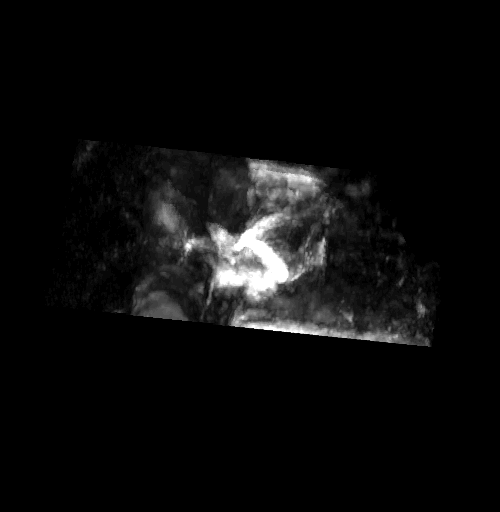

[22 of 22 positions shown; findings below may reference images not displayed]

FINDINGS: Lower chest: No acute findings.

Hepatobiliary: No hepatic masses identified. Moderate diffuse
hepatic steatosis is seen. Gallbladder is unremarkable. No evidence
of biliary ductal dilatation or choledocholithiasis.

Pancreas: No mass or inflammatory changes. No evidence of pancreatic
ductal dilatation or pancreas divisum.

Spleen:  Within normal limits in size and appearance.

Adrenals/Urinary Tract: No masses identified. No evidence of
hydronephrosis.

Stomach/Bowel: Visualized portion unremarkable.

Vascular/Lymphatic: No pathologically enlarged lymph nodes
identified. No abdominal aortic aneurysm.

Other:  None.

Musculoskeletal:  No suspicious bone lesions identified.
IMPRESSION: No acute findings.

Moderate hepatic steatosis.

## 2021-08-04 NOTE — Addendum Note (Signed)
Encounter addended by: Annie Paras on: 08/04/2021 1:44 PM  Actions taken: Letter saved

## 2021-09-22 ENCOUNTER — Ambulatory Visit (INDEPENDENT_AMBULATORY_CARE_PROVIDER_SITE_OTHER): Payer: Self-pay | Admitting: Neurology

## 2021-12-15 ENCOUNTER — Ambulatory Visit (INDEPENDENT_AMBULATORY_CARE_PROVIDER_SITE_OTHER): Payer: Self-pay | Admitting: Neurology

## 2021-12-29 ENCOUNTER — Ambulatory Visit (INDEPENDENT_AMBULATORY_CARE_PROVIDER_SITE_OTHER): Payer: Self-pay | Admitting: Neurology

## 2022-01-23 ENCOUNTER — Encounter (INDEPENDENT_AMBULATORY_CARE_PROVIDER_SITE_OTHER): Payer: Self-pay

## 2022-01-23 ENCOUNTER — Ambulatory Visit (INDEPENDENT_AMBULATORY_CARE_PROVIDER_SITE_OTHER): Payer: Self-pay | Admitting: Surgery

## 2022-01-27 ENCOUNTER — Encounter (HOSPITAL_COMMUNITY): Payer: Self-pay | Admitting: Family Medicine

## 2022-01-27 NOTE — Nursing Note (Signed)
Received referral from [facility]. Records reviewed by Dr. Larene Beach, endocrinology to follow hx of thyroid cancer. 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 Informed Tiffany, at [facility]. She verbalized understanding.

## 2022-03-02 ENCOUNTER — Ambulatory Visit (INDEPENDENT_AMBULATORY_CARE_PROVIDER_SITE_OTHER): Payer: Self-pay | Admitting: Neurology

## 2022-04-20 ENCOUNTER — Encounter (INDEPENDENT_AMBULATORY_CARE_PROVIDER_SITE_OTHER): Payer: Self-pay | Admitting: Neurology

## 2022-04-20 ENCOUNTER — Other Ambulatory Visit: Payer: Self-pay

## 2022-04-20 ENCOUNTER — Ambulatory Visit: Payer: MEDICAID | Attending: Neurology | Admitting: Neurology

## 2022-04-20 VITALS — BP 178/100 | HR 73 | Temp 98.2°F | Ht 65.0 in | Wt 204.8 lb

## 2022-04-20 DIAGNOSIS — G4733 Obstructive sleep apnea (adult) (pediatric): Secondary | ICD-10-CM | POA: Insufficient documentation

## 2022-04-20 DIAGNOSIS — Z79899 Other long term (current) drug therapy: Secondary | ICD-10-CM | POA: Insufficient documentation

## 2022-04-20 DIAGNOSIS — R5383 Other fatigue: Secondary | ICD-10-CM | POA: Insufficient documentation

## 2022-04-20 DIAGNOSIS — F17200 Nicotine dependence, unspecified, uncomplicated: Secondary | ICD-10-CM | POA: Insufficient documentation

## 2022-04-20 DIAGNOSIS — G35 Multiple sclerosis: Secondary | ICD-10-CM | POA: Insufficient documentation

## 2022-04-20 DIAGNOSIS — Z733 Stress, not elsewhere classified: Secondary | ICD-10-CM | POA: Insufficient documentation

## 2022-04-20 DIAGNOSIS — F418 Other specified anxiety disorders: Secondary | ICD-10-CM

## 2022-04-20 DIAGNOSIS — F419 Anxiety disorder, unspecified: Secondary | ICD-10-CM | POA: Insufficient documentation

## 2022-04-20 MED ORDER — TIZANIDINE 4 MG TABLET
ORAL_TABLET | ORAL | 5 refills | Status: DC
Start: 2022-04-20 — End: 2022-10-01

## 2022-04-20 MED ORDER — VENLAFAXINE ER 37.5 MG CAPSULE,EXTENDED RELEASE 24 HR
37.5000 mg | ORAL_CAPSULE | Freq: Every day | ORAL | 5 refills | Status: DC
Start: 2022-04-20 — End: 2022-06-01
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
## 2022-04-20 NOTE — Patient Instructions (Signed)
[email or web address] is our patient portal.

## 2022-04-28 ENCOUNTER — Other Ambulatory Visit (INDEPENDENT_AMBULATORY_CARE_PROVIDER_SITE_OTHER): Payer: Self-pay | Admitting: Neurology

## 2022-04-28 MED ORDER — DEXTROAMPHETAMINE-AMPHETAMINE 5 MG TABLET
5.0000 mg | ORAL_TABLET | Freq: Every day | ORAL | 0 refills | Status: DC
Start: 2022-04-28 — End: 2022-05-22

## 2022-04-29 ENCOUNTER — Encounter (INDEPENDENT_AMBULATORY_CARE_PROVIDER_SITE_OTHER): Payer: Self-pay | Admitting: Neurology

## 2022-04-29 DIAGNOSIS — G35 Multiple sclerosis: Secondary | ICD-10-CM

## 2022-05-02 ENCOUNTER — Encounter (INDEPENDENT_AMBULATORY_CARE_PROVIDER_SITE_OTHER): Payer: Self-pay | Admitting: Neurology

## 2022-05-06 ENCOUNTER — Encounter (INDEPENDENT_AMBULATORY_CARE_PROVIDER_SITE_OTHER): Payer: Self-pay | Admitting: Neurology

## 2022-05-13 ENCOUNTER — Encounter (HOSPITAL_COMMUNITY): Payer: Self-pay

## 2022-05-14 ENCOUNTER — Inpatient Hospital Stay (HOSPITAL_COMMUNITY)
Admission: RE | Admit: 2022-05-14 | Discharge: 2022-05-14 | Disposition: A | Discharge status: Home / Self Care | Payer: MEDICAID | Source: Ambulatory Visit | Attending: Neurology | Admitting: Neurology

## 2022-05-14 ENCOUNTER — Inpatient Hospital Stay
Admission: RE | Admit: 2022-05-14 | Discharge: 2022-05-14 | Disposition: A | Discharge status: Home / Self Care | Payer: MEDICAID | Source: Ambulatory Visit | Attending: Neurology | Admitting: Neurology

## 2022-05-14 ENCOUNTER — Other Ambulatory Visit: Payer: Self-pay

## 2022-05-14 DIAGNOSIS — G35 Multiple sclerosis: Secondary | ICD-10-CM

## 2022-05-14 MED ORDER — GADOTERIDOL 279.3 MG/ML INTRAVENOUS SOLUTION
20.0000 mL | INTRAVENOUS | Status: AC
Start: 2022-05-14 — End: 2022-05-14
  Administered 2022-05-14: 20 mL via INTRAVENOUS

## 2022-05-22 ENCOUNTER — Other Ambulatory Visit (INDEPENDENT_AMBULATORY_CARE_PROVIDER_SITE_OTHER): Payer: Self-pay | Admitting: Neurology

## 2022-05-26 MED ORDER — DEXTROAMPHETAMINE-AMPHETAMINE 5 MG TABLET
5.0000 mg | ORAL_TABLET | Freq: Every day | ORAL | 0 refills | Status: DC
Start: 2022-05-29 — End: 2022-06-26

## 2022-05-29 ENCOUNTER — Ambulatory Visit (INDEPENDENT_AMBULATORY_CARE_PROVIDER_SITE_OTHER): Payer: Self-pay | Admitting: Neurology

## 2022-05-29 NOTE — Telephone Encounter (Addendum)
Regarding: [name]  ----- Message from Molli Barrows Dains sent at 05/29/2022  9:42 AM EDT -----  Pt is calling her pharmacy does not have her medication and she is asking if it can be called to the following pharmacy       atenoloL (TENORMIN) 100 mg Oral Tablet       Preferred Pharmacy    [facility] - [place], [place] - [address]    [address] [place] [address]    Phone: [phone] Fax: [phone]    Hours: Not open 24 hours

## 2022-06-01 ENCOUNTER — Telehealth: Payer: MEDICAID | Admitting: Student in an Organized Health Care Education/Training Program

## 2022-06-01 ENCOUNTER — Encounter (HOSPITAL_COMMUNITY): Payer: Self-pay | Admitting: Student in an Organized Health Care Education/Training Program

## 2022-06-01 DIAGNOSIS — F331 Major depressive disorder, recurrent, moderate: Secondary | ICD-10-CM

## 2022-06-01 DIAGNOSIS — F329 Major depressive disorder, single episode, unspecified: Secondary | ICD-10-CM

## 2022-06-01 DIAGNOSIS — Z720 Tobacco use: Secondary | ICD-10-CM

## 2022-06-01 DIAGNOSIS — F410 Panic disorder [episodic paroxysmal anxiety] without agoraphobia: Secondary | ICD-10-CM

## 2022-06-01 DIAGNOSIS — F411 Generalized anxiety disorder: Secondary | ICD-10-CM

## 2022-06-01 MED ORDER — DIAZEPAM 5 MG TABLET
2.5000 mg | ORAL_TABLET | Freq: Every day | ORAL | 2 refills | Status: DC | PRN
Start: 2022-06-01 — End: 2022-08-28

## 2022-06-01 MED ORDER — VORTIOXETINE 10 MG TABLET
10.0000 mg | ORAL_TABLET | Freq: Every day | ORAL | 3 refills | Status: DC
Start: 2022-06-01 — End: 2022-11-16

## 2022-06-01 NOTE — H&P (Signed)
I personally offered the service to the patient, and obtained verbal consent to provide this service.    Kristin Liverpool, MD     TELEMEDICINE DOCUMENTATION:    Patient Location:  MyChart video visit from home address: [address]  [place] [address]    Patient/family aware of provider location:  yes  Patient/family consent for telemedicine:  yes  Examination observed and performed by:  Kristin Liverpool, MD      PSYCHIATRY AND BEHAVIORAL MEDICINE  [facility]  Video Telemedicine Outpatient H&P     Name: Kristin Williamson  MRN: [id number]    Date: 06/01/2022  Age: 57 y.o.         CC:   Chief Complaint   Patient presents with    Depression    Anxiety    Panic Attack       Kristin Williamson is a 57 y.o. female with known psychiatric history of MDD, GAD with panic attacks, and TUD.    Regarding mood, patient reports a long history of depressive episodes. Currently, she reports depressive symptoms about 1/2 of days. Endorses depressed mood, anhedonia, low motivation, early morning awakening, decreased appetite, decreased energy, and poor concentration. She endorses chronic passive SI that is brief in nature. The most recent passive SI was about 1 week ago after the patient's dog got sick. The patient denies recent or current plans/intent to harm herself. She identified her family as a reason to be alive. She identified her husband as someone she can talk to if SI returns or gets worse. Denies history of SA or NSSI.      Denies any discrete periods of time concerning for manic symptoms, including decreased need for sleep, increased goal directed behavior, increased risky activities, increased sexual behavior.    Very anxious, gets pulsating sensation when anxious  Anxious most days  Frequently feels overwhelmed  Happens daily  Anxiety is worse  Intermittent panic attacks, 3x per week    Regarding anxiety, patient endorses daily anxiety. Patient states her anxiety is overwhelming and prevents  her from doing certain ADLs. She reports intermittent panic attacks about 3 times per week. She is unable to identify specific triggers for anxiety or panic attacks.     Regarding trauma, patient denies flashbacks, nightmares, hypervigilance, avoidance behaviors.    Regarding psychotic symptoms, patient denies auditory or visual hallucinations. Denies any known history of persistent delusional thought patterns or paranoid thoughts or behavior.    Regarding substance use,   Alcohol: denies  Tobacco: Endorses smoking 4-6 cigarettes / day  Benzodiazepines: denies  THC: denies  Opiates: denies recent use; she was previously prescribed opiates long-term, but none in >6 years  Stimulants: denies use other than prescribed Adderall for fatigue  PCP/Ecstasy: denies  Designer Drugs: denies    Regarding stressors, patient reports her biggest stressor is helping raise 45 year old granddaughter.     Medical History  Primary Care Provider: Maceo Pro, MD  Past Medical History:   Diagnosis Date    COPD (chronic obstructive pulmonary disease) (CMS HCC)     DM (diabetes mellitus) (CMS HCC)     HTN (hypertension)     Hx of ischemic left MCA stroke     OSA (obstructive sleep apnea)     Seizures (CMS HCC)     Thyroid cancer (CMS HCC)           No past surgical history pertinent negatives on file.  Allergies:  Allergies   Allergen Reactions    Dpt-Haemophilus Ps(Tet.Conj.) Rash    Gabapentin Mental Status Effect and Rash     Other reaction(s): Pins and Needles Sensation on head    Tramadol Itching and Rash    Tetanus Antitoxin      Other reaction(s): Arm Swelling, Fever, N/V    Latex, Natural Rubber Hives/ Urticaria and Swelling     Other reaction(s): Hive    Tree Nuts Swelling       Medications:  Current Outpatient Medications   Medication Sig    acetaminophen (TYLENOL) 500 mg Oral Tablet Take 2 Tablets (1,000 mg total) by mouth    albuterol sulfate (PROVENTIL OR VENTOLIN OR PROAIR) 90 mcg/actuation Inhalation oral inhaler Take 2  Puffs by inhalation    albuterol sulfate (PROVENTIL) 2.5 mg /3 mL (0.083 %) Inhalation nebulizer solution Take 3 mL (2.5 mg total) by inhalation    amLODIPine (NORVASC) 5 mg Oral Tablet Take 1 Tablet (5 mg total) by mouth    aspirin 81 mg Oral Tablet, Chewable Chew 1 Tablet (81 mg total) Once a day    atenoloL (TENORMIN) 100 mg Oral Tablet Take 1 Tablet (100 mg total) by mouth Once a day    atorvastatin (LIPITOR) 80 mg Oral Tablet Take 1 Tablet (80 mg total) by mouth    cholecalciferol, vitamin D3, 1,250 mcg (50,000 unit) Oral Capsule Take 1 Capsule (50,000 Units total) by mouth    clopidogreL (PLAVIX) 75 mg Oral Tablet Take 1 Tablet (75 mg total) by mouth Once a day    dextroamphetamine-amphetamine (ADDERALL) 5 mg Oral Tablet Take 1 Tablet (5 mg total) by mouth Once a day    diazePAM (VALIUM) 5 mg Oral Tablet Take 0.5 Tablets (2.5 mg total) by mouth Once per day as needed for Anxiety (16 doses provided per month)    fluticasone propion-salmeteroL (ADVAIR) 250-50 mcg/dose Inhalation oral diskus inhaler Take 1 Puff (1 Inhalation total) by inhalation    insulin glargine 100 unit/mL Subcutaneous Insulin Pen Inject 50 Units under the skin    insulin lispro 100 unit/mL Subcutaneous Insulin Pen Inject 4 to 18 units subcutaneously up to 3-4 times a day.    levothyroxine (SYNTHROID) 150 mcg Oral Tablet Take 1 Tablet (150 mcg total) by mouth Once a day    lisinopriL (PRINIVIL) 20 mg Oral Tablet     potassium chloride (KLOR-CON) 10 mEq Oral Tablet Sustained Release     tiotropium bromide (SPIRIVA RESPIMAT) 2.5 mcg/actuation Inhalation oral inhaler Take by inhalation    tiZANidine (ZANAFLEX) 4 mg Oral Tablet Take 1 tab in AM, 1 tab in afternoon, and 2 tabs at night.    vortioxetine (TRINTELLIX) 10 mg Oral Tablet Take 1 Tablet (10 mg total) by mouth Once a day       Psychiatric History  - Outpatient Psychiatry: previously saw psychiatrist in [place]  - Outpatient Psychotherapy: denies  - Neuropsychiatric Testing: she thinks she had  NPT in [place], but unsure   - Inpatient Psychiatric Admissions: denies  - Past Psychiatric Medication Trials: Zoloft, Valium, Adderall IR, carbamazepine, Effexor XR, Klonopin, Paxil, Lexapro (maybe?), Abilify, Cymbalta, Ativan  - Suicide Attempts: denies  - Self-Harming Behavior: denies    Social History  Living: [place], [place] with husband, daughter, and granddaughter  Family: married, 4 kids in total  Trauma: denies  Occupation: not working since 1996 (multiple sclerosis diagnosis); gets SSI  Legal: denies    Family History  - Suicide Attempts: distant paternal uncle committed  himself  - Substance Use: denies  - Mental Illness: mom with depression, brother with psychotic episode (he delusion that he was another person, but was never hospitalized), dad with Alzheimer's, daughter possibly with some sort of psychotic disorder    ROS: Negative. Any positives noted in subjective.      Vitals: Vitals unable to be performed secondary to this being a video encounter.    Physical Exam: *All findings were made through video observation.*  There were no visible lesions on exposed skin surfaces that were in view.  The patient's breathing was observed to be comfortable and normal. The patient is in no acute distress. There are no focal neurological deficits observed.     Mental Status Examination: *All findings were made through video observation.*  Orientation: Fully oriented to person, place, time and situation.   Appearance:appears older than stated age, adequately groomed, casually dressed.  Eye Contact:  Good.    Behavior:  Cooperative.    Attention:  Good.    Speech:  Normal rate and volume.    Motor:  No psychomotor retardation or agitation.    Mood:  "I have been having a hard time".    Affect: appears somewhat dysthymic.    Thought Process: Linear.     Thought Content: Appropriate without paranoia or delusions.    Suicidal Ideation:  None endorsed.    Homicidal Ideation:  None endorsed.  Perception:  No hallucinations  endorsed.  Cognition:  Good abstract ability.   Memory:  Immediate, delayed, and remote memory intact.  Fund of knowledge: Appropriate for level of education.  Insight: Fair.    Judgment:  Fair.  Gait/strength: Unable to assess due to video interface.    Pertinent Studies/Imaging:  All recent labs and imaging reviewed if available    Board of Pharmacy:                  [place] Controlled Substance Full Name Report Report Date 06/01/2022   From 05/31/2021 To 05/31/2022 Date of Birth [birth year] Prescription Count 1   Last Name Mcvicker First Name jaleigh Middle Name                      Patients included in report that appear to match the search criteria.   Last Name First Name Middle Name Gender Address   [facility]  F [address] , [place], [place], [address]            Prescriber Name Prescriber DEA & Zip Dispenser Name Dispenser DEA & Zip Rx Written Date Rx Dispense Date  & Date Sold   Rx Number Product Name MEDD Status Strength Qty Days # of Refill Sched Payment Type   Tinesha Siegrist [address], [place], [address]   WardJaclynn Major, [place] [id number] [phone] [place]. [id number] [phone] 04/28/2022 04/29/2022 04/29/2022 [id number] Amphetamine Mixture INACTIVE 5 MG 30 30 0/0 CII Medicaid                Assessment:  Cassandre Oleksy is a 57 y.o. female with known psychiatric history of MDD, GAD with panic attacks, and TUD. Currently, patient reports poorly-controlled depression, anxiety, and panic attacks. Patient's depressive symptoms occur ~1/2 of days; however, she also has chronic passive SI. The passive SI is brief and usually in response to psychosocial stressors. She feels like anxiety is her most prominent symptom, and she has ~3 panic attacks per week. Of note, the patient is on low-dose Effexor XR for  mood/anxiety and Adderall IR for chronic fatigue 2/2 multiple sclerosis.    Plan to stop Effexor XR and start Trintellix 10 mg daily for mood/anxiety/panic attacks. Will start Valium 2.5 mg daily PRN (15  tablets per month) for short-term. Discussed with patient that Valium will be discontinued in future.    Psychiatric Diagnoses: MDD, recurrent, current episode moderate; GAD with panic attacks; TUD  Pertinent PMH: HTN, COPD, diabetes, s/p left MCA stroke in Dec 2022, multiple sclerosis, s/p TBI in 2009, history of seizures after TBI    Plan:  -stop Effexor XR  -start Trintellix 10 mg daily for mood/anxiety  -discussed risk of manic activation, anxiety, sleep disturbances, sexual dysfunction, GI upset  -start Valium 2.5 mg daily PRN for anxiety/panic attacks    -discussed this will be temporary medication   -discussed risk of addiction and potential for tolerance   -discussed risk of sedation and cognitive impairment   -advised patient not to drive while on Valium    - Safety: No acute safety concerns. Patient advised to report to nearest emergency department or to call 911 if having any suicidal or homicidal ideations.   - Encouraged patient to use MyChart messaging or to call the Behavioral Medicine call center [phone]) with any non-urgent questions or concerns.    -Patient will return to care in 6 weeks.  -Message will be sent to scheduler to arrange appointment. The appointment may be a telephone, video, or face-to-face visit depending on the evolving status of the COVID-19 pandemic.    Orders Placed This Encounter    vortioxetine (TRINTELLIX) 10 mg Oral Tablet    diazePAM (VALIUM) 5 mg Oral Tablet     Kristin Liverpool, MD 06/01/2022 16:01  Dept of Behavioral Medicine and Psychiatry

## 2022-06-14 ENCOUNTER — Other Ambulatory Visit (INDEPENDENT_AMBULATORY_CARE_PROVIDER_SITE_OTHER): Payer: Self-pay | Admitting: Ophthalmology

## 2022-06-14 DIAGNOSIS — H539 Unspecified visual disturbance: Secondary | ICD-10-CM

## 2022-06-15 ENCOUNTER — Ambulatory Visit (HOSPITAL_COMMUNITY): Payer: Self-pay

## 2022-06-15 ENCOUNTER — Ambulatory Visit (INDEPENDENT_AMBULATORY_CARE_PROVIDER_SITE_OTHER): Payer: Self-pay | Admitting: Ophthalmology

## 2022-06-15 ENCOUNTER — Other Ambulatory Visit: Payer: Self-pay | Admitting: Nurse Practitioner

## 2022-06-16 NOTE — Telephone Encounter (Signed)
Requested medication (s) are due for refill today: yes  Requested medication (s) are on the active medication list: yes  Last refill:  10/15/20  Future visit scheduled: no  Notes to clinic:  Unable to refill per protocol, cannot delegate.      Requested Prescriptions  Pending Prescriptions Disp Refills   tiZANidine (ZANAFLEX) 4 MG tablet [Pharmacy Med Name: TIZANIDINE 4MG  TABLETS] 270 tablet     Sig: Take 1 tablet by mouth three times daily as needed     Not Delegated - Cardiovascular:  Alpha-2 Agonists - tizanidine Failed - 06/15/2022  9:18 AM      Failed - This refill cannot be delegated      Failed - Valid encounter within last 6 months    Recent Outpatient Visits           1 year ago Hypertension associated with diabetes (HCC)   [place] [facility] McElwee, Lauren A, NP   2 years ago Depression, recurrent (HCC)   [place] [facility] [place], Corrie Dandy T, NP   2 years ago Encounter to establish care   [place] [facility] Bl[place], Goldsmith T, NP               hydrOXYzine (ATARAX) 25 MG tablet [Pharmacy Med Name: HYDROXYZINE HCL 25MG  TABS (WHITE)] 90 tablet 4    Sig: TAKE 1 TABLET BY MOUTH EVERY 8 HOURS AS NEEDED     Ear, Nose, and Throat:  Antihistamines 2 Failed - 06/15/2022  9:18 AM      Failed - Cr in normal range and within 360 days    Creatinine  Date Value Ref Range Status  09/01/2019 30.2 20.0 - 300.0 mg/dL Final   Creatinine, Ser  Date Value Ref Range Status  10/17/2020 0.73 0.44 - 1.00 mg/dL Final         Failed - Valid encounter within last 12 months    Recent Outpatient Visits           1 year ago Hypertension associated with diabetes (HCC)   [place] [facility] McElwee, Lauren A, NP   2 years ago Depression, recurrent (HCC)   [place] [facility] [place], Corrie Dandy T, NP   2 years ago Encounter to establish care   [place] [facility] [place], Dorie Rank,  NP

## 2022-06-18 ENCOUNTER — Encounter (INDEPENDENT_AMBULATORY_CARE_PROVIDER_SITE_OTHER): Payer: Self-pay | Admitting: Student in an Organized Health Care Education/Training Program

## 2022-06-26 ENCOUNTER — Other Ambulatory Visit (INDEPENDENT_AMBULATORY_CARE_PROVIDER_SITE_OTHER): Payer: Self-pay | Admitting: Neurology

## 2022-06-29 MED ORDER — DEXTROAMPHETAMINE-AMPHETAMINE 5 MG TABLET
5.0000 mg | ORAL_TABLET | Freq: Every day | ORAL | 0 refills | Status: DC
Start: 2022-06-29 — End: 2022-07-31

## 2022-07-28 ENCOUNTER — Other Ambulatory Visit (INDEPENDENT_AMBULATORY_CARE_PROVIDER_SITE_OTHER): Payer: Self-pay | Admitting: Ophthalmology

## 2022-07-28 DIAGNOSIS — H539 Unspecified visual disturbance: Secondary | ICD-10-CM

## 2022-07-29 ENCOUNTER — Ambulatory Visit (INDEPENDENT_AMBULATORY_CARE_PROVIDER_SITE_OTHER): Payer: Self-pay | Admitting: Ophthalmology

## 2022-07-29 ENCOUNTER — Ambulatory Visit (HOSPITAL_COMMUNITY): Payer: Self-pay

## 2022-07-30 ENCOUNTER — Ambulatory Visit (INDEPENDENT_AMBULATORY_CARE_PROVIDER_SITE_OTHER): Payer: Self-pay | Admitting: Neurology

## 2022-07-30 NOTE — Telephone Encounter (Addendum)
Advised to get it filled by her PCP back in April.    Clovis Pu, RN         Regarding: [name]  ----- Message from Zane Herald sent at 07/30/2022 12:21 PM EDT -----  Refill request     atenoloL (TENORMIN) 100 mg Oral Tablet    Preferred Pharmacy     [facility] [phone] [name], [place] - [address] AT [facility] OF   [address]    [address] [place] [address]    Phone: [phone] Fax: [phone]    Hours: Not open 24 hours

## 2022-07-31 ENCOUNTER — Other Ambulatory Visit (INDEPENDENT_AMBULATORY_CARE_PROVIDER_SITE_OTHER): Payer: Self-pay | Admitting: Neurology

## 2022-07-31 MED ORDER — DEXTROAMPHETAMINE-AMPHETAMINE 5 MG TABLET
5.0000 mg | ORAL_TABLET | Freq: Every day | ORAL | 0 refills | Status: DC
Start: 2022-07-31 — End: 2022-08-28

## 2022-08-02 ENCOUNTER — Encounter (INDEPENDENT_AMBULATORY_CARE_PROVIDER_SITE_OTHER): Payer: Self-pay | Admitting: Neurology

## 2022-08-03 ENCOUNTER — Other Ambulatory Visit: Payer: Self-pay

## 2022-08-03 MED ORDER — DEXTROAMPHETAMINE-AMPHETAMINE 5 MG TABLET
5.0000 mg | ORAL_TABLET | Freq: Every day | ORAL | 0 refills | Status: DC
Start: 2022-08-03 — End: 2022-09-30

## 2022-08-28 ENCOUNTER — Other Ambulatory Visit (INDEPENDENT_AMBULATORY_CARE_PROVIDER_SITE_OTHER): Payer: Self-pay | Admitting: Student in an Organized Health Care Education/Training Program

## 2022-08-28 ENCOUNTER — Other Ambulatory Visit (INDEPENDENT_AMBULATORY_CARE_PROVIDER_SITE_OTHER): Payer: Self-pay | Admitting: Neurology

## 2022-08-28 ENCOUNTER — Encounter (INDEPENDENT_AMBULATORY_CARE_PROVIDER_SITE_OTHER): Payer: Self-pay

## 2022-08-28 MED ORDER — DIAZEPAM 5 MG TABLET
2.50 mg | ORAL_TABLET | Freq: Every day | ORAL | 2 refills | Status: AC | PRN
Start: 2022-08-28 — End: ?

## 2022-08-28 MED ORDER — DEXTROAMPHETAMINE-AMPHETAMINE 5 MG TABLET
5.0000 mg | ORAL_TABLET | Freq: Every day | ORAL | 0 refills | Status: DC
Start: 2022-08-30 — End: 2022-09-15

## 2022-08-28 NOTE — Telephone Encounter (Signed)
Refill for Valium 2.5 mg daily PRN (16 doses per month) sent in. Will send message to get patient back in schedule.    Orders Placed This Encounter    diazePAM (VALIUM) 5 mg Oral Tablet     Leodis Liverpool, MD 08/28/2022 16:04

## 2022-09-15 ENCOUNTER — Other Ambulatory Visit: Payer: Self-pay

## 2022-09-15 ENCOUNTER — Encounter (HOSPITAL_COMMUNITY): Payer: Self-pay

## 2022-09-15 ENCOUNTER — Emergency Department
Admission: EM | Admit: 2022-09-15 | Discharge: 2022-09-15 | Disposition: A | Discharge status: Home / Self Care | Payer: MEDICAID | Attending: NURSE PRACTITIONER | Admitting: NURSE PRACTITIONER

## 2022-09-15 DIAGNOSIS — G35 Multiple sclerosis: Secondary | ICD-10-CM | POA: Insufficient documentation

## 2022-09-15 DIAGNOSIS — H469 Unspecified optic neuritis: Secondary | ICD-10-CM | POA: Insufficient documentation

## 2022-09-15 HISTORY — DX: Malignant neoplasm of colon, unspecified (CMS HCC): C18.9

## 2022-09-15 HISTORY — DX: Cerebral infarction, unspecified (CMS HCC): I63.9

## 2022-09-15 HISTORY — DX: Multiple sclerosis (CMS HCC): G35

## 2022-09-15 HISTORY — DX: Bell's palsy: G51.0

## 2022-09-15 HISTORY — DX: Presence of spectacles and contact lenses: Z97.3

## 2022-09-15 LAB — CBC WITH DIFF
BASOPHIL #: 0.09 10*3/uL (ref 0.00–0.30)
BASOPHIL %: 1.1 % (ref 0.0–3.0)
EOSINOPHIL #: 0.4 10*3/uL (ref 0.00–0.80)
EOSINOPHIL %: 4.6 % (ref 0.0–7.0)
HCT: 46.2 % (ref 39.0–59.5)
HGB: 14.8 g/dL (ref 13.0–17.0)
LYMPHOCYTE #: 2.31 10*3/uL (ref 0.60–5.10)
LYMPHOCYTE %: 27 % (ref 15.0–46.0)
MCH: 28.2 pg (ref 28.0–35.0)
MCHC: 32 g/dL — ABNORMAL LOW (ref 33.0–37.0)
MCV: 88.2 fL (ref 80.0–100.0)
MONOCYTE #: 0.41 10*3/uL (ref 0.10–1.70)
MONOCYTE %: 4.7 % (ref 3.0–15.0)
MPV: 7.5 fL — ABNORMAL LOW (ref 8.0–12.0)
NEUTROPHIL #: 5.38 10*3/uL (ref 1.70–8.60)
NEUTROPHIL %: 62.6 % (ref 42.0–78.0)
PLATELETS: 263 10*3/uL (ref 130–440)
RBC: 5.24 10*6/uL (ref 4.00–5.30)
RDW: 12.2 % (ref 12.0–15.0)
WBC: 8.6 10*3/uL (ref 4.0–11.0)

## 2022-09-15 LAB — URINALYSIS, MACROSCOPIC
BILIRUBIN: NEGATIVE mg/dL
BLOOD: NEGATIVE mg/dL
GLUCOSE: NEGATIVE mg/dL
KETONES: NEGATIVE mg/dL
LEUKOCYTES: NEGATIVE WBCs/uL
NITRITE: NEGATIVE
PH: 6
PROTEIN: NEGATIVE mg/dL
SPECIFIC GRAVITY: 1.025 (ref 1.005–1.030)

## 2022-09-15 LAB — URINALYSIS, MICROSCOPIC

## 2022-09-15 LAB — COMPREHENSIVE METABOLIC PANEL, NON-FASTING
ALBUMIN: 3.8 g/dL (ref 3.5–5.0)
ALKALINE PHOSPHATASE: 130 U/L (ref 50–130)
ALT (SGPT): 27 U/L — ABNORMAL HIGH (ref 8–22)
ANION GAP: 12 mmol/L (ref 4–13)
AST (SGOT): 18 U/L (ref 8–45)
BILIRUBIN TOTAL: 0.9 mg/dL (ref 0.3–1.3)
BUN/CREA RATIO: 30 — ABNORMAL HIGH (ref 6–22)
BUN: 21 mg/dL (ref 8–25)
CALCIUM: 9.6 mg/dL (ref 8.6–10.2)
CHLORIDE: 105 mmol/L (ref 96–111)
CO2 TOTAL: 23 mmol/L (ref 22–30)
CREATININE: 0.7 mg/dL (ref 0.60–1.05)
ESTIMATED GFR - FEMALE: >90 mL/min/BSA (ref >=60)
GLUCOSE: 144 mg/dL — ABNORMAL HIGH (ref 65–125)
POTASSIUM: 4 mmol/L (ref 3.5–5.1)
PROTEIN TOTAL: 7.3 g/dL (ref 6.4–8.3)
SODIUM: 140 mmol/L (ref 136–145)

## 2022-09-15 LAB — SEDIMENTATION RATE: ERYTHROCYTE SEDIMENTATION RATE (ESR): 12 mm/hr (ref 0–30)

## 2022-09-15 LAB — C-REACTIVE PROTEIN (CRP): CRP INFLAMMATION: 4 mg/L (ref <8.0)

## 2022-09-15 MED ORDER — METHYLPREDNISOLONE SOD SUCCINATE 40 MG/ML SOLUTION FOR INJ. WRAPPER
40.0000 mg | INTRAMUSCULAR | Status: AC
Start: 2022-09-15 — End: 2022-09-15
  Administered 2022-09-15: 40 mg via INTRAVENOUS
  Filled 2022-09-15: qty 1

## 2022-09-15 MED ORDER — SODIUM CHLORIDE 0.9 % (FLUSH) INJECTION SYRINGE
1.0000 mL | INJECTION | INTRAMUSCULAR | Status: DC | PRN
Start: 2022-09-15 — End: 2022-09-15

## 2022-09-15 MED ORDER — PREDNISONE 50 MG TABLET
50.0000 mg | ORAL_TABLET | Freq: Every day | ORAL | 0 refills | Status: DC
Start: 2022-09-15 — End: 2022-10-02

## 2022-09-15 NOTE — ED Nurses Note (Signed)
Patient with no pain. Appears comfortable. Calling for a ride

## 2022-09-15 NOTE — ED Provider Notes (Signed)
[facility]  ED Primary Provider Note  History of Present Illness   Chief Complaint   Patient presents with    Headache     Kristin Williamson is a 57 y.o. female who had concerns including Headache.  Arrival: The patient arrived by Ambulance    Patient presents today from her PCP office for complaints of headache, generalized weakness, bilateral eye pressure L>R, and visual changes.  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  She does follow with [facility] Neurology specialist and states she recently saw her neurologist last month.  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  Patient does not have loss of color vision.  She denies any photophobia.  She denies dizziness, loss of consciousness, upper/lower extremity weakness, slurred speech, chest pain and shortness of breath.  EMS was called to PCP office for patient to be transported to the emergency department for concern of optic neuritis.  Patient does wear prescription glasses at baseline.  She reports she does have an appointment to see Neuro-Ophthalmology at [facility] in August.      Headache    History Reviewed This Encounter: Medical History  Surgical History  Family History  Social History    Physical Exam   ED Triage Vitals   BP (Non-Invasive) 09/15/22 1100 (!) 115/93   Heart Rate 09/15/22 1100 64   Respiratory Rate 09/15/22 1100 16   Temperature 09/15/22 1100 36.3 C (97.4 F)   SpO2 09/15/22 1100 94 %   Weight 09/15/22 1103 86.6 kg (191 lb)   Height 09/15/22 1103 1.651 m (5\' 5" )     Physical Exam  Constitutional:       General: She is not in acute distress.     Appearance: Normal appearance. She is normal weight. She is not ill-appearing, toxic-appearing or diaphoretic.   HENT:      Head: Normocephalic.      Right Ear: Tympanic membrane and ear  canal normal.      Left Ear: Tympanic membrane and ear canal normal.   Eyes:      General: No scleral icterus.        Right eye: No discharge.         Left eye: No discharge.      Extraocular Movements: Extraocular movements intact.      Conjunctiva/sclera: Conjunctivae normal.      Pupils: Pupils are equal, round, and reactive to light.      Comments: Pupils are reactive.  Red reflex is is noted bilaterally and without abnormalities.  Patient's sensory and blink responses intact.  Patient is able to complete extraocular movements but does voice pain when doing so.  There is no edema, erythema, or discharge present.   Cardiovascular:      Rate and Rhythm: Normal rate and regular rhythm.      Heart sounds: Normal heart sounds. No murmur heard.     No gallop.   Pulmonary:      Effort: Pulmonary effort is normal. No respiratory distress.      Breath sounds: Normal breath sounds. No wheezing or rhonchi.   Chest:      Chest wall: No tenderness.   Abdominal:      General: Abdomen is flat. Bowel sounds are normal. There is no distension.      Palpations: Abdomen is  soft.      Tenderness: There is no abdominal tenderness. There is no guarding.   Musculoskeletal:         General: Normal range of motion.      Cervical back: Normal range of motion. No rigidity.   Skin:     General: Skin is warm and dry.      Capillary Refill: Capillary refill takes less than 2 seconds.   Neurological:      General: No focal deficit present.      Mental Status: She is alert and oriented to person, place, and time. Mental status is at baseline.      Cranial Nerves: No cranial nerve deficit.      Sensory: No sensory deficit.      Motor: No weakness.      Coordination: Coordination normal.      Gait: Gait normal.   Psychiatric:         Mood and Affect: Mood normal.         Behavior: Behavior normal.         Thought Content: Thought content normal.         Judgment: Judgment normal.       Patient Data     Labs Ordered/Reviewed   COMPREHENSIVE  METABOLIC PANEL, NON-FASTING - Abnormal; Notable for the following components:       Result Value    BUN/CREA RATIO 30 (*)     GLUCOSE 144 (*)     ALT (SGPT) 27 (*)     All other components within normal limits   CBC WITH DIFF - Abnormal; Notable for the following components:    MPV 7.5 (*)     MCHC 32.0 (*)     All other components within normal limits   SEDIMENTATION RATE - Normal   C-REACTIVE PROTEIN (CRP) - Normal   URINALYSIS, MICROSCOPIC - Normal   CBC/DIFF    Narrative:     The following orders were created for panel order CBC/DIFF.  Procedure                               Abnormality         Status                     ---------                               -----------         ------                     CBC WITH GMWN[[id number]]                Abnormal            Final result                 Please view results for these tests on the individual orders.   URINALYSIS, MACROSCOPIC AND MICROSCOPIC W/CULTURE REFLEX    Narrative:     The following orders were created for panel order URINALYSIS, MACROSCOPIC AND MICROSCOPIC W/CULTURE REFLEX.  Procedure                               Abnormality         Status                     ---------                               -----------         ------  URINALYSIS, MACROSCOPIC[[id number]]                          Final result               URINALYSIS, MICROSCOPIC[[id number]]      Normal              Final result                 Please view results for these tests on the individual orders.   URINALYSIS, MACROSCOPIC     No orders to display     Medical Decision Making        Medical Decision Making  Patient evaluated today for headache, generalized weakness, and bilateral eye pressure/vision changes.  Patient's visual acuity is at baseline.  There is no color vision loss.  I consulted this case with Dr. Rosalia Hammers as well as [facility] Neurology on call physician who advises a single dose IV steroid dose with a 3-5 day taper of oral steroids would be  appropriate for the patient.  I have discussed this with the patient who is agreeable to this treatment at this time.  She states in the past she typically receives a short prednisone burst to correct acute exacerbation of symptoms.  Patient has a scheduled appointment with [facility] Neuro-ophthalmology on August 16th.  I advised patient to follow up with her primary care provider within 1 week or sooner and keep her scheduled appointment with Ophthalmology.  I advised patient to return to the emergency department for any new or worsening of her symptoms or vision changes.  She verbalizes understanding of all instructions and denies further questions/concerns at this time.    Amount and/or Complexity of Data Reviewed  Labs: ordered.    Risk  Prescription drug management.                       Medications Administered in the ED   methylPREDNISolone sod succ (SOLU-medrol) 40 mg/mL injection (40 mg Intravenous Given 09/15/22 1351)     Clinical Impression   Multiple sclerosis exacerbation (CMS HCC) (Primary)   Bilateral optic neuritis       Disposition: Discharged

## 2022-09-15 NOTE — ED Triage Notes (Signed)
Headache

## 2022-09-15 NOTE — ED Triage Notes (Signed)
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 Reports right and left eye blurriness

## 2022-09-15 NOTE — ED Nurses Note (Signed)
Patient discharged home with self. AVS reviewed with patient. A written copy of AVS and discharge instructions were given to patient. Prescriptions given x 1. Questions sufficiently answered as needed. Patient encouraged to follow-up with PCP as indicated. In the event of an emergency, patient instructed to call 911 or go to the nearest ED.

## 2022-09-15 NOTE — Discharge Instructions (Signed)
Please complete steroid prescription as directed.    Please follow up with your primary care provider within 1 week or sooner.    Please keep your scheduled appointment with [facility] neuro-Ophthalmology in early August.    For any new or worsening of your symptoms, you may return to the emergency department.

## 2022-09-30 ENCOUNTER — Other Ambulatory Visit (INDEPENDENT_AMBULATORY_CARE_PROVIDER_SITE_OTHER): Payer: Self-pay | Admitting: Neurology

## 2022-10-01 ENCOUNTER — Other Ambulatory Visit (INDEPENDENT_AMBULATORY_CARE_PROVIDER_SITE_OTHER): Payer: Self-pay | Admitting: Neurology

## 2022-10-01 MED ORDER — DEXTROAMPHETAMINE-AMPHETAMINE 5 MG TABLET
5.0000 mg | ORAL_TABLET | Freq: Every day | ORAL | 0 refills | Status: AC
Start: 2022-10-01 — End: ?

## 2022-10-02 ENCOUNTER — Encounter (INDEPENDENT_AMBULATORY_CARE_PROVIDER_SITE_OTHER): Payer: Self-pay | Admitting: Neuro-ophthalmology

## 2022-10-02 ENCOUNTER — Ambulatory Visit: Payer: MEDICAID | Attending: Neuro-ophthalmology | Admitting: Neuro-ophthalmology

## 2022-10-02 ENCOUNTER — Other Ambulatory Visit: Payer: Self-pay

## 2022-10-02 ENCOUNTER — Inpatient Hospital Stay (HOSPITAL_BASED_OUTPATIENT_CLINIC_OR_DEPARTMENT_OTHER)
Admission: RE | Admit: 2022-10-02 | Discharge: 2022-10-02 | Disposition: A | Discharge status: Home / Self Care | Payer: MEDICAID | Source: Ambulatory Visit

## 2022-10-02 ENCOUNTER — Inpatient Hospital Stay (HOSPITAL_COMMUNITY)
Admission: RE | Admit: 2022-10-02 | Discharge: 2022-10-02 | Disposition: A | Discharge status: Home / Self Care | Payer: MEDICAID | Source: Ambulatory Visit | Attending: Neuro-ophthalmology | Admitting: Neuro-ophthalmology

## 2022-10-02 ENCOUNTER — Other Ambulatory Visit (INDEPENDENT_AMBULATORY_CARE_PROVIDER_SITE_OTHER): Payer: Self-pay | Admitting: Neuro-ophthalmology

## 2022-10-02 DIAGNOSIS — E113291 Type 2 diabetes mellitus with mild nonproliferative diabetic retinopathy without macular edema, right eye: Secondary | ICD-10-CM

## 2022-10-02 DIAGNOSIS — H539 Unspecified visual disturbance: Secondary | ICD-10-CM

## 2022-10-02 DIAGNOSIS — H2510 Age-related nuclear cataract, unspecified eye: Secondary | ICD-10-CM

## 2022-10-02 DIAGNOSIS — G35 Multiple sclerosis: Secondary | ICD-10-CM | POA: Insufficient documentation

## 2022-10-02 DIAGNOSIS — H5210 Myopia, unspecified eye: Secondary | ICD-10-CM | POA: Insufficient documentation

## 2022-10-02 NOTE — Progress Notes (Addendum)
[facility]  [address]  [place] [address]  Operated by [facility]         Patient Name: Kristin Williamson  MRN#: [id number]  Birthdate: 1965-04-20    Date of Service: 10/02/2022    Chief Complaint    Multiple Sclerosis         Kristin Williamson is a 57 y.o. female who presents today for evaluation/consultation of:  HPI    Referred by Dr Wandra Mannan for multiple sclerosis  Had a stroke 12.13.2022, has had diplopia since stroke  Having pain OS, LUL and brow.  Gets anisocoria daily, the left one is the one that is bigger, if she sleeps/naps when she wakes up pupils are normal.   Has diplopia, two complete images, vertical, worse at times.  Was seen recently @ [facility] for optic neuritis, was given a dose of IV steroids, and a three day RX for 50 mg Pred PO, has been a couple days since last dose.  Vision is not good, wears RX glasses but takes off a lot since they do not help.  For past 3 weeks has had floaters, black clouds and a spot in the middle of her vision, they move with eye movement, last 10-20 minutes, almost daily.  Not using any eye drops, does have tearing OS.  Is type 1 diabetic, about a year ago, sugar was too low and passed out in the grocery store, hit her head, OS, had black eye.  Gets HA's daily, take Ibuprofen 800mg , gives some relief.    BS 122 currently  No results found for: "HA1C"  Per PT HA1C last month 6.7       Last edited by Coralee Pesa, COA on 10/02/2022  2:35 PM.        ROS    Positive for: Neurological, Endocrine, Cardiovascular, Eyes, Respiratory, Psychiatric  Negative for: Constitutional, Gastrointestinal, Skin, Genitourinary, Musculoskeletal, HENT, Allergic/Imm, Heme/Lymph  Last edited by Coralee Pesa, COA on 10/02/2022  2:35 PM.         All other systems Negative    Melissa Knotts, COA  10/02/2022, 14:49        Base Eye Exam       Visual Acuity (Snellen - Linear)         Right Left    Dist cc 20/40 -2 20/100 -1    Dist ph cc 20/30  20/60      Correction: Glasses              Tonometry (Tonopen, 3:19 PM)         Right Left    Pressure 20 20              Pupils         Shape React APD    Right Round Minimal None    Left Round Minimal None              Visual Fields (Counting fingers)         Right Left      Full    Restrictions Total superior temporal deficiency; Partial outer superior nasal deficiency               Extraocular Movement         Right Left     Full Full              Neuro/Psych       Oriented x3: Yes    Mood/Affect:  Normal              Dilation       Both eyes: 1.0% Mydriacyl, 2.5% Phenylephrine @ 3:16 PM                  Strabismus Exam       Method: Alternate cover      Distance Near Near +3DS N Bifocals     ortho                  - - - - - -  ortho  - - - - - -                      ortho  - -  - -  ortho  - -  - -  ortho                      - - - - - -  ortho  - - - - - -                SME   Ortho     Margaretann Loveless, MD 10/02/2022 15:54          Slit Lamp and Fundus Exam       External Exam         Right Left    External Normal Normal              Slit Lamp Exam         Right Left    Lids/Lashes ptosis Normal    Conjunctiva/Sclera White and quiet White and quiet    Cornea Clear Clear    Anterior Chamber Deep and quiet Deep and quiet    Iris Round and reactive Round and reactive    Lens Clear Clear    Anterior Vitreous Normal Normal              Fundus Exam         Right Left    Disc Normal, retinal hemorrhage inf to disc Normal    C/D Ratio 0.2 0.2    Macula Normal Normal    Vessels Normal Normal    Periphery mild NPDR BDR                  Refraction       Wearing Rx         Sphere Cylinder Axis Add    Right -4.00 Sphere  +2.25    Left -5.00 +0.75 175 +2.25      Age: 89yrs    Type: Bifocal              Manifest Refraction         Sphere Cylinder Axis Dist VA Add Near [place]    Right -4.00 Sphere  20/25-1 +2.50 20/20    Left -3.50 +0.75 005 20/25 +2.50 20/20              Final Rx         Sphere Cylinder Axis Dist VA Add [place]     Right -4.00 Sphere  20/25-1 +2.50 20/20    Left -3.50 +0.75 005 20/25 +2.50 20/20      Expiration Date: 10/02/2023                        There is no problem list on file for this patient.  Family History:  Family Medical History:    None           Current Outpatient Medications   Medication Sig Dispense Refill    acetaminophen (TYLENOL) 500 mg Oral Tablet Take 2 Tablets (1,000 mg total) by mouth      albuterol sulfate (PROVENTIL) 2.5 mg /3 mL (0.083 %) Inhalation nebulizer solution Take 3 mL (2.5 mg total) by inhalation      amLODIPine (NORVASC) 5 mg Oral Tablet Take 1 Tablet (5 mg total) by mouth      aspirin 81 mg Oral Tablet, Chewable Chew 1 Tablet (81 mg total) Once a day      atenoloL (TENORMIN) 100 mg Oral Tablet Take 1 Tablet (100 mg total) by mouth Once a day      atorvastatin (LIPITOR) 80 mg Oral Tablet Take 1 Tablet (80 mg total) by mouth      cholecalciferol, vitamin D3, 1,250 mcg (50,000 unit) Oral Capsule Take 1 Capsule (50,000 Units total) by mouth      clopidogreL (PLAVIX) 75 mg Oral Tablet Take 1 Tablet (75 mg total) by mouth Once a day      dextroamphetamine-amphetamine (ADDERALL) 5 mg Oral Tablet Take 1 Tablet (5 mg total) by mouth Once a day 30 Tablet 0    diazePAM (VALIUM) 5 mg Oral Tablet Take 0.5 Tablets (2.5 mg total) by mouth Once per day as needed for Anxiety (16 doses provided per month) 8 Tablet 2    fluticasone propion-salmeteroL (ADVAIR) 250-50 mcg/dose Inhalation oral diskus inhaler Take 1 Puff (1 Inhalation total) by inhalation      insulin glargine 100 unit/mL Subcutaneous Insulin Pen Inject 50 Units under the skin      insulin lispro 100 unit/mL Subcutaneous Insulin Pen Inject 4 to 18 units subcutaneously up to 3-4 times a day.      levothyroxine (SYNTHROID) 150 mcg Oral Tablet Take 1 Tablet (150 mcg total) by mouth Once a day      lisinopriL (PRINIVIL) 20 mg Oral Tablet       potassium chloride (KLOR-CON) 10 mEq Oral Tablet Sustained Release       tiotropium bromide (SPIRIVA  RESPIMAT) 2.5 mcg/actuation Inhalation oral inhaler Take by inhalation      tiZANidine (ZANAFLEX) 4 mg Oral Tablet TAKE 1 TABLET BY MOUTH EVERY MORNING, 1 TABLET IN THE AFTERNOON AND 2 TABLETS AT NIGHT 120 Tablet 5    vortioxetine (TRINTELLIX) 10 mg Oral Tablet Take 1 Tablet (10 mg total) by mouth Once a day 30 Tablet 3     No current facility-administered medications for this visit.       ENCOUNTER DIAGNOSES     ICD-10-CM   1. Myopia, unspecified laterality  H52.10   2. Multiple sclerosis (CMS HCC)  G35     No orders of the defined types were placed in this encounter.    OCT RNFL: 10/02/2022  Right eye: good signal strength, normal, ave 96  Left eye: good signal strength, normal, ave 95    OCT GCL: 10/02/2022  Right eye: no thinning  Left eye: no thinning    HVF: 10/02/2022  Right eye: Reliability poor, nas artifact, sup/inf defect  Left eye: Reliability poor, peripheral constriction       MD Addition to HPI:   Patient initially had optic neuritis involving both eyes and was diagnosed with MS back in 1996. She was following with [facility] previously up till 2022. Following Dr. [name]      MRI brain [facility]  05/14/22  IMPRESSION:  1.Small scattered the periventricular and juxtacortical demyelination are suggestive of multiple sclerosis. No evidence of gadolinium enhancement is is seen.  2.The remainder of the cerebral parenchyma is normal without significant atrophy.  3.Normal calvarium and skull base are present.    Ophthalmic Plan of Care:    1) mild NPDR RE  Given the likelyhood of vision loss from complications of diabetes I discussed the importance of tight glucose control.  I have recommended that they continue being seen by their PCP or Endocrinologist  for strict diabetic monitoring and treatment.  I have also informed them of the need for frequent eye exams to prevent or treat future vision threatening disease. I recommended a diet and exercise modification as well as good blood glucose control. The patient has been warned  of the risk of blindness from poorly controlled diabetes.    2) NS  - presurgical and will monitor   - new glasses given     3) MS  - following neurology  - patient says she got optic neuritis in the past OU  - no GCL thinning, no INO     Follow up:    I have asked Jaxsyn Erbes to follow up 1 year           I have seen and examined the above patient. I discussed the above diagnoses listed in the assessment and the above ophthalmic plan of care with the patient and patient's family. All questions were answered. I reviewed and, when necessary, made changes to the technician/resident note, documented ophthalmology exam, chief complaint, history of present illness, allergies, review of systems, past medical, past surgical, family and social history. I personally reviewed and interpreted all testing and/or imaging performed at this visit and agree with the resident's or fellow's interpretation. Any exceptions/additions are edited/noted in the relevant encounter fields.      Margaretann Loveless, MD  10/02/2022, 15:27  Assistant Professor  Pediatric & Neuro-ophthalmology  [facility]   [phone]

## 2022-10-21 ENCOUNTER — Ambulatory Visit (INDEPENDENT_AMBULATORY_CARE_PROVIDER_SITE_OTHER): Payer: Self-pay | Admitting: Nurse Practitioner

## 2022-10-22 ENCOUNTER — Other Ambulatory Visit: Payer: Self-pay

## 2022-10-22 ENCOUNTER — Emergency Department
Admission: EM | Admit: 2022-10-22 | Discharge: 2022-10-22 | Discharge status: Against Medical Advice | Payer: MEDICAID | Source: Skilled Nursing Facility | Attending: Medical | Admitting: Medical

## 2022-10-22 ENCOUNTER — Ambulatory Visit (HOSPITAL_BASED_OUTPATIENT_CLINIC_OR_DEPARTMENT_OTHER): Payer: MEDICAID | Admitting: Nurse Practitioner

## 2022-10-22 ENCOUNTER — Encounter (INDEPENDENT_AMBULATORY_CARE_PROVIDER_SITE_OTHER): Payer: Self-pay | Admitting: Nurse Practitioner

## 2022-10-22 VITALS — BP 194/102 | HR 110 | Temp 96.2°F | Ht 65.0 in | Wt 201.5 lb

## 2022-10-22 DIAGNOSIS — Z7189 Other specified counseling: Secondary | ICD-10-CM | POA: Insufficient documentation

## 2022-10-22 DIAGNOSIS — R519 Headache, unspecified: Secondary | ICD-10-CM | POA: Insufficient documentation

## 2022-10-22 DIAGNOSIS — I10 Essential (primary) hypertension: Secondary | ICD-10-CM | POA: Insufficient documentation

## 2022-10-22 DIAGNOSIS — G8929 Other chronic pain: Secondary | ICD-10-CM | POA: Insufficient documentation

## 2022-10-22 DIAGNOSIS — Z5321 Procedure and treatment not carried out due to patient leaving prior to being seen by health care provider: Secondary | ICD-10-CM | POA: Insufficient documentation

## 2022-10-22 DIAGNOSIS — R9431 Abnormal electrocardiogram [ECG] [EKG]: Secondary | ICD-10-CM | POA: Insufficient documentation

## 2022-10-22 DIAGNOSIS — G35 Multiple sclerosis: Secondary | ICD-10-CM | POA: Insufficient documentation

## 2022-10-22 DIAGNOSIS — R202 Paresthesia of skin: Secondary | ICD-10-CM | POA: Insufficient documentation

## 2022-10-22 MED ORDER — AMITRIPTYLINE 25 MG TABLET
25.0000 mg | ORAL_TABLET | Freq: Every evening | ORAL | 5 refills | Status: DC
Start: 2022-10-22 — End: 2023-05-03
  Filled 2022-10-22: qty 30, 30d supply, fill #0

## 2022-10-22 NOTE — ED Triage Notes (Signed)
[facility] - Emergency Department   Provider in Triage Note     Patient Name: Loucinda Worku  Patient MRN: [id number]  Date and Time of Assessment: 10/22/2022 13:19     Chief Complaint   Patient presents with    Hypertension     196/104 reported BP PTA while at her neurology appt. Has not had her BP medications in 3 days d/t pharmacy not filling it yet. C/o HA. Denies dizziness, chest pain       Brief HPI:   57 yr old patient, sent from her neurology appt due to elevated BP. Pt has hx of HTN and takes meds, but currently out. Only c/o is of headache    Focused Physical Exam:   ED Triage Vitals [10/22/22 1234]   BP (Non-Invasive) (!) 155/101   Heart Rate 97   Respiratory Rate 16   Temperature 36.3 C (97.3 F)   SpO2 97 %   Weight 91.4 kg (201 lb 8 oz)   Height 1.651 m (5\' 5" )     AxO in NAD    Preliminary Plan:  Labs ordered  EKG ordered  Imaging ordered  Patient will return to waiting room  Delsa Sale, PA-C

## 2022-10-22 NOTE — Telephone Encounter (Signed)
Specialty Pharmacy Note    Prior Authorization for medication Amitriptyline is not required by payor Medicaid. If you have any questions don't hesitate to contact the Specialty Pharmacy [facility], Pharmacy Technician

## 2022-10-22 NOTE — Progress Notes (Signed)
[facility] Department of Neurology  [facility]    Date:  10/22/2022  Name: Kristin Williamson  Age:  57 y.o.  Referring Physician:   Dawayne Cirri, PA-C  [address]  [place],  [place] [address]      History of Present Illness:  History obtained from:  patient    ------------------------  Disease categorization: RRMS  Date diagnosed: 1996    Current DMT: None  Previous DMTs: Betaseron, Avonex and Copaxone   Steroid history:    Neurologic history:  -1996: Bilateral ON.  -Multiple symptoms since then including trigeminal neuralgia.  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    Pertinent studies:  - MRI Brain wo contrast 01/2021 (Per report): There are multiple foci of restricted diffusion scattered throughout the left frontal, temporal, and parietal lobes. No mass effect or midline shift.   - MRI Brain wo contrast 10/2019 (Per report): Supratentorial white matter signal abnormalities, compatible with multiple sclerosis. No evidence of active demyelination.   ------------------------    Kristin Williamson is a 57 y.o. female who presents with a chief concern of   Chief Complaint   Patient presents with    Follow Up 6 Months    Multiple Sclerosis    Medication Refill      Since Last Visit:  - ?s about should she be on DMT.   - burning and tingling in her hands is more bothersome now since she stopped most symptom management post stroke.  Was on hydroxyzine for itching before also.   - No new M.S symptoms over the past several years.   - MRIs updated recently.   - Pt reports she is having extremely difficulty getting her prescriptions filled at the [facility] in [place] town.  The [facility] is 1 of only 2 pharmacies in town. She is post stroke in 2022 and has been without HTN meds for at least 3 days.       Past Medical History:   Diagnosis Date    Bell's palsy     Colon cancer (CMS HCC)     COPD (chronic obstructive pulmonary disease) (CMS HCC)     CVA (cerebrovascular accident) (CMS  HCC)     DM (diabetes mellitus) (CMS HCC)     HTN (hypertension)     Hx of ischemic left MCA stroke     Multiple sclerosis (CMS HCC)     1996    OSA (obstructive sleep apnea)     Seizures (CMS HCC)     Thyroid cancer (CMS HCC)     Thyroid cancer (CMS HCC)     Wears glasses      Past Surgical History:   Procedure Laterality Date    COLON SURGERY      12 inches removed due to cancer    HX APPENDECTOMY      cancer    HX THYROIDECTOMY       Current Outpatient Medications   Medication Sig    acetaminophen (TYLENOL) 500 mg Oral Tablet Take 2 Tablets (1,000 mg total) by mouth    albuterol sulfate (PROVENTIL) 2.5 mg /3 mL (0.083 %) Inhalation nebulizer solution Take 3 mL (2.5 mg total) by inhalation    amitriptyline (ELAVIL) 25 mg Oral Tablet Take 1 Tablet (25 mg total) by mouth Every night    amLODIPine (NORVASC) 5 mg Oral Tablet Take 1 Tablet (5 mg total) by mouth    aspirin 81 mg Oral Tablet, Chewable Chew 1  Tablet (81 mg total) Once a day    atenoloL (TENORMIN) 100 mg Oral Tablet Take 1 Tablet (100 mg total) by mouth Once a day    atorvastatin (LIPITOR) 80 mg Oral Tablet Take 1 Tablet (80 mg total) by mouth    cholecalciferol, vitamin D3, 1,250 mcg (50,000 unit) Oral Capsule Take 1 Capsule (50,000 Units total) by mouth    clopidogreL (PLAVIX) 75 mg Oral Tablet Take 1 Tablet (75 mg total) by mouth Once a day    dextroamphetamine-amphetamine (ADDERALL) 5 mg Oral Tablet Take 1 Tablet (5 mg total) by mouth Once a day    diazePAM (VALIUM) 5 mg Oral Tablet Take 0.5 Tablets (2.5 mg total) by mouth Once per day as needed for Anxiety (16 doses provided per month)    fluticasone propion-salmeteroL (ADVAIR) 250-50 mcg/dose Inhalation oral diskus inhaler Take 1 Puff (1 Inhalation total) by inhalation    insulin glargine 100 unit/mL Subcutaneous Insulin Pen Inject 50 Units under the skin    insulin lispro 100 unit/mL Subcutaneous Insulin Pen Inject 4 to 18 units subcutaneously up to 3-4 times a day.    levothyroxine (SYNTHROID) 150  mcg Oral Tablet Take 1 Tablet (150 mcg total) by mouth Once a day    lisinopriL (PRINIVIL) 20 mg Oral Tablet     potassium chloride (KLOR-CON) 10 mEq Oral Tablet Sustained Release     tiotropium bromide (SPIRIVA RESPIMAT) 2.5 mcg/actuation Inhalation oral inhaler Take by inhalation    tiZANidine (ZANAFLEX) 4 mg Oral Tablet TAKE 1 TABLET BY MOUTH EVERY MORNING, 1 TABLET IN THE AFTERNOON AND 2 TABLETS AT NIGHT    vortioxetine (TRINTELLIX) 10 mg Oral Tablet Take 1 Tablet (10 mg total) by mouth Once a day (Patient not taking: Reported on 10/22/2022)     Allergies   Allergen Reactions    Dpt-Haemophilus Ps(Tet.Conj.) Rash    Gabapentin Mental Status Effect and Rash     Other reaction(s): Pins and Needles Sensation on head    Tramadol Itching and Rash    Tetanus Antitoxin      Other reaction(s): Arm Swelling, Fever, N/V    Latex, Natural Rubber Hives/ Urticaria and Swelling     Other reaction(s): Hive    Tree Nuts Swelling     Family Medical History:    None         Social History     Socioeconomic History    Marital status: Married   Tobacco Use    Smoking status: Some Days     Types: Cigarettes    Smokeless tobacco: Never       Review of Systems  Gen: No malaise. ID: No infections. HEENT: No hearing or vision changes. Respiratory: No shortness of breath or cough. Cardiac: No chest pain. GU: No hematuria. GI: No GI bleeding. Extremities: No peripheral edema. Musculoskeletal: No joint pain, swelling. Neuro: As above. Psychiatric: No mood changes.    Examination:    Vitals: BP (!) 194/102   Pulse (!) 110   Temp (!) 35.7 C (96.2 F)   Ht 1.651 m (5\' 5" )   Wt 91.4 kg (201 lb 8 oz)   SpO2 98%   BMI 33.53 kg/m       General Exam:  General: No acute distress. Red face  HEENT: No scleral icterus.  Extremities: Trace pedal edema  Psychiatric: Affect calm.    Neurologic Exam:  Orientation: Awake, alert, appropriately oriented.  Memory: Recent and remote memory appear intact. Formal memory testing not completed  today.  Attention: Normal in conversation.  Knowledge: Appropriate.  Language: Fluent with intact comprehension.  Speech: Normal.  Cranial nerves: Pupils are equal and reactive bilaterally. Extraocular movements are intact. Facial sensation intact. Face activates symmetrically. Hearing intact to finger rub bilaterally. Shoulder shrug 5/5 bilaterally. Tongue and uvula midline.   Sensory: Intact to light touch, vibration, and proprioception throughout.  Muscle tone: Normal.    Muscle exam:  Arm Right Left Leg Right Left   Deltoid 5/5 5/5 Iliopsoas 4/5 4/5   Biceps 5/5 5/5 Quads 4/5 4/5   Triceps 5/5 5/5 Hamstrings 5/5 5/5   Interossei 5/5 5/5 Ankle Dorsi Flexion 5/5 5/5   APB 5/5 5/5 Ankle Plantar Flexion 5/5 5/5     Reflexes:   RJ BJ TJ KJ     Right 2+ 2+ 2+ 2+     Left 2+ 2+ 2+ 2+       Coordination: Finger-nose without dysmetria bilaterally.      Assessment and Plan:    Assessment/Plan   1. Multiple sclerosis (CMS HCC)    2. Hypertension, unspecified type    3. Encounter for medication counseling    4. Chronic headaches    5. Paresthesia of arm    RRMS diagnosis in the 1990s.  Has not been on DMT for the last several years and has remained stable overall from an M.S perspective.  Discussion of DMT start today - discussed risk vs benefit.  Since she feels stable she opts not to start DMT.  Due for MRIs 04/2023.  She was on meds for symptom management previously, but reports after her stroke in 2022 she did not restart many.    She does experience tingling in her hands and l arm.  Some pins and needles/crawling feeling.  Also, itching - previously managed with hydroxyzine and elavil in past.  Will retry elavil.  Rx sent.  She asked this be sent to our med center pharmacy so that she may have an easier pick up while she is here today.  Can send to home pharmacy later if needed.   HTN and history of stoke - managed by PCP.  However, the patient recounts significant difficulties obtaining her rx's from [facility] in  [place].  She has been without amlodipine and atenolol for "at least 3 days."  Her exam is otherwise stable with the exception of her BP reading and flushed face.  No acute distress.  Given her hx of stroke, DM, and accessibility issues with meds, I have advised her to seek attention for her BP at the ED so that she does not experience further complications form it.  She agrees.  Pt transported to ED check in via wheelchair.    RTC in 6 months.  Call sooner with changes/concerns.       Orders Placed This Encounter    amitriptyline (ELAVIL) 25 mg Oral Tablet       Return in about 6 months (around 04/21/2023).     On the day of the encounter, a total of 52 minutes was spent on encounter including review of historical information, examination, documentation, and post visit activities.     Read Drivers, APRN 10/22/2022  Advance Practice Provider, Department of Neurology

## 2022-10-23 DIAGNOSIS — I10 Essential (primary) hypertension: Secondary | ICD-10-CM

## 2022-10-23 DIAGNOSIS — R9431 Abnormal electrocardiogram [ECG] [EKG]: Secondary | ICD-10-CM

## 2022-10-23 LAB — ECG 12-LEAD
Atrial Rate: 88 {beats}/min
Calculated P Axis: 64 degrees
Calculated R Axis: 15 degrees
Calculated T Axis: 69 degrees
PR Interval: 192 ms
QRS Duration: 84 ms
QT Interval: 390 ms
QTC Calculation: 471 ms
Ventricular rate: 88 {beats}/min

## 2022-10-29 ENCOUNTER — Other Ambulatory Visit (INDEPENDENT_AMBULATORY_CARE_PROVIDER_SITE_OTHER): Payer: Self-pay | Admitting: Neurology

## 2022-10-29 MED ORDER — DEXTROAMPHETAMINE-AMPHETAMINE 5 MG TABLET
5.0000 mg | ORAL_TABLET | Freq: Every morning | ORAL | 0 refills | Status: AC
Start: 2022-10-29 — End: 2022-11-27

## 2022-10-29 MED ORDER — DEXTROAMPHETAMINE-AMPHETAMINE 5 MG TABLET
5.0000 mg | ORAL_TABLET | Freq: Every morning | ORAL | 0 refills | Status: AC
Start: 2022-11-28 — End: 2022-12-27

## 2022-10-29 MED ORDER — DEXTROAMPHETAMINE-AMPHETAMINE 5 MG TABLET
5.0000 mg | ORAL_TABLET | Freq: Every morning | ORAL | 0 refills | Status: DC
Start: 2022-12-28 — End: 2023-01-01

## 2022-11-16 ENCOUNTER — Telehealth (INDEPENDENT_AMBULATORY_CARE_PROVIDER_SITE_OTHER): Payer: MEDICAID | Admitting: Student in an Organized Health Care Education/Training Program

## 2022-11-16 DIAGNOSIS — F410 Panic disorder [episodic paroxysmal anxiety] without agoraphobia: Secondary | ICD-10-CM

## 2022-11-16 DIAGNOSIS — F331 Major depressive disorder, recurrent, moderate: Secondary | ICD-10-CM

## 2022-11-16 DIAGNOSIS — F332 Major depressive disorder, recurrent severe without psychotic features: Secondary | ICD-10-CM | POA: Insufficient documentation

## 2022-11-16 DIAGNOSIS — F411 Generalized anxiety disorder: Secondary | ICD-10-CM

## 2022-11-16 MED ORDER — DIAZEPAM 5 MG TABLET
2.5000 mg | ORAL_TABLET | Freq: Every day | ORAL | 2 refills | Status: DC | PRN
Start: 2022-11-16 — End: 2022-12-04

## 2022-11-16 MED ORDER — SERTRALINE 50 MG TABLET
50.0000 mg | ORAL_TABLET | Freq: Every day | ORAL | 3 refills | Status: DC
Start: 2022-11-16 — End: 2023-02-12
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
## 2022-11-17 ENCOUNTER — Ambulatory Visit (HOSPITAL_COMMUNITY): Payer: Self-pay | Admitting: Student in an Organized Health Care Education/Training Program

## 2022-11-17 NOTE — Telephone Encounter (Signed)
Called pharmacy back per MyChart triage queue. Question about Valium script answered. No changes made.    Leodis Liverpool, MD 11/17/2022 09:35

## 2022-11-27 ENCOUNTER — Other Ambulatory Visit: Payer: Self-pay

## 2022-11-30 ENCOUNTER — Other Ambulatory Visit (INDEPENDENT_AMBULATORY_CARE_PROVIDER_SITE_OTHER): Payer: Self-pay | Admitting: Neurology

## 2022-12-01 ENCOUNTER — Other Ambulatory Visit (INDEPENDENT_AMBULATORY_CARE_PROVIDER_SITE_OTHER): Payer: Self-pay | Admitting: Student in an Organized Health Care Education/Training Program

## 2022-12-01 NOTE — Telephone Encounter (Signed)
Patient requesting the following medication be sent to: [id number] [name], [place] - [address] AT [facility] [address]       diazePAM (VALIUM) 5 mg Oral Tablet Take 0.5 Tablets (2.5 mg total) by mouth Once per day as needed for Anxiety (16 doses provided per month) Dispense: 8 Tablet, Refills: 2 ordered  11/16/2022     Last appointment: 11/16/2022    Follow up:    01/18/2023                             [facility] Controlled Substance Full Name Report Report Date 12/01/2022   From 08/31/2022 To 11/30/2022 Date of Birth Apr 11, 1965 Prescription Count 5   Last Name [facility] First Name Kristin Williamson Middle Name                                                         Patients included in report that appear to match the search criteria.   Last Name First Name Middle Name Gender Address   [facility]   [address] , [place], [place], [address]                           Prescriber Name Prescriber DEA & Zip Dispenser Name Dispenser DEA & Zip Rx Written Date Rx Dispense Date  & Date Sold    Rx Number Product Name MEDD Status Strength Qty Days # of Refill Sched Payment Type   [name] [address] [place], [address]   Leodis Liverpool [id number]  [place]. [id number] [phone] 08/28/2022 10/30/2022 10/30/2022 [id number] Diazepam INACTIVE 5 MG 8 15 2/2 CIV Medicaid   [name], [name], [place] [id number] [address] [facility]. [id number] [phone] 10/29/2022 10/30/2022 10/30/2022 [id number] Amphetamine Mixture INACTIVE 5 MG 30 30 0/0 CII Medicaid   [name], [name], [place] [id number] [phone] [place] [id number] [phone] 10/01/2022 10/02/2022 10/02/2022 [id number] Amphetamine Mixture INACTIVE 5 MG 30 30 0/0 CII Medicaid   Leodis Liverpool [id number]  [employer] [employer] [id number] [phone] 08/28/2022 10/01/2022 10/01/2022 [id number] Diazepam INACTIVE 5 MG 8 15 1/2 CIV Medicaid   [name] [name], [place] [id number] [address] [facility]. [id number] [phone] 08/28/2022 09/02/2022 09/02/2022 [id number] Amphetamine Mixture INACTIVE 5 MG 30 30 0/0 CII  Medicaid                                                 Jamie Kato, RN  12/01/2022, 10:39

## 2022-12-17 ENCOUNTER — Ambulatory Visit (HOSPITAL_COMMUNITY): Payer: Self-pay | Admitting: Student in an Organized Health Care Education/Training Program

## 2022-12-17 NOTE — Telephone Encounter (Addendum)
Spoke with pharmacist and discussed that patient is indeed supposed to be on Elavil 25 mg qhs and Zoloft 50 mg daily. All questions answered.    Leodis Liverpool, MD 12/17/2022 14:17     ----- Message from [name]. T sent at 12/17/2022  2:14 PM EDT -----  Regarding: FW: medications/interactions  ----- Message from Vern Claude sent at 12/17/2022  2:03 PM EDT -----  Copied From CRM #[id number].Jessica from the [facility] called to clarify that she is supposed to be on both the Amitriptyline and the Sertraline. Amitriptyline was filled at [facility] pharmacy on Oct. 11th. They are asking for a call back asap. Thank You Soledad Gerlach

## 2023-01-01 ENCOUNTER — Other Ambulatory Visit (INDEPENDENT_AMBULATORY_CARE_PROVIDER_SITE_OTHER): Payer: Self-pay | Admitting: Neurology

## 2023-01-18 ENCOUNTER — Encounter (INDEPENDENT_AMBULATORY_CARE_PROVIDER_SITE_OTHER): Payer: MEDICAID | Admitting: Student in an Organized Health Care Education/Training Program

## 2023-01-20 MED ORDER — DEXTROAMPHETAMINE-AMPHETAMINE 5 MG TABLET
5.0000 mg | ORAL_TABLET | Freq: Every morning | ORAL | 0 refills | Status: DC
Start: 2023-01-31 — End: 2023-03-04

## 2023-01-26 ENCOUNTER — Encounter (HOSPITAL_COMMUNITY): Payer: Self-pay | Admitting: Medical

## 2023-01-26 ENCOUNTER — Emergency Department (HOSPITAL_COMMUNITY): Payer: MEDICAID

## 2023-01-26 ENCOUNTER — Emergency Department
Admission: EM | Admit: 2023-01-26 | Discharge: 2023-01-26 | Disposition: A | Discharge status: Home / Self Care | Payer: MEDICAID | Source: Ambulatory Visit | Attending: Medical | Admitting: Medical

## 2023-01-26 ENCOUNTER — Other Ambulatory Visit: Payer: Self-pay

## 2023-01-26 DIAGNOSIS — Z8673 Personal history of transient ischemic attack (TIA), and cerebral infarction without residual deficits: Secondary | ICD-10-CM | POA: Insufficient documentation

## 2023-01-26 DIAGNOSIS — G35 Multiple sclerosis: Secondary | ICD-10-CM | POA: Insufficient documentation

## 2023-01-26 DIAGNOSIS — R569 Unspecified convulsions: Secondary | ICD-10-CM | POA: Insufficient documentation

## 2023-01-26 DIAGNOSIS — R079 Chest pain, unspecified: Secondary | ICD-10-CM | POA: Insufficient documentation

## 2023-01-26 DIAGNOSIS — R110 Nausea: Secondary | ICD-10-CM | POA: Insufficient documentation

## 2023-01-26 LAB — CBC WITH DIFF
BASOPHIL #: <0.04 10*3/uL (ref <=0.20)
BASOPHIL %: 0.2 %
EOSINOPHIL #: 0.27 10*3/uL (ref <=0.50)
EOSINOPHIL %: 2.8 %
HCT: 40.5 % (ref 34.8–46.0)
HGB: 13.9 g/dL (ref 11.5–16.0)
IMMATURE GRANULOCYTE #: <0.04 10*3/uL (ref <0.10)
IMMATURE GRANULOCYTE %: 0.2 % (ref 0.0–1.0)
LYMPHOCYTE #: 1.41 10*3/uL (ref 1.00–4.80)
LYMPHOCYTE %: 14.5 %
MCH: 28.9 pg (ref 26.0–32.0)
MCHC: 34.3 g/dL (ref 31.0–35.5)
MCV: 84.2 fL (ref 78.0–100.0)
MONOCYTE #: 0.55 10*3/uL (ref 0.20–1.10)
MONOCYTE %: 5.7 %
MPV: 9.6 fL (ref 8.7–12.5)
NEUTROPHIL #: 7.43 10*3/uL (ref 1.50–7.70)
NEUTROPHIL %: 76.6 %
PLATELETS: 275 10*3/uL (ref 150–400)
RBC: 4.81 10*6/uL (ref 3.85–5.22)
RDW-CV: 13 % (ref 11.5–15.5)
WBC: 9.7 10*3/uL (ref 3.7–11.0)

## 2023-01-26 LAB — COMPREHENSIVE METABOLIC PANEL, NON-FASTING
ALBUMIN: 3.8 g/dL (ref 3.5–5.0)
ALKALINE PHOSPHATASE: 130 U/L (ref 50–130)
ALT (SGPT): 19 U/L (ref <31)
ANION GAP: 9 mmol/L (ref 4–13)
AST (SGOT): 20 U/L (ref 11–34)
BILIRUBIN TOTAL: 0.5 mg/dL (ref 0.3–1.3)
BUN/CREA RATIO: 24 — ABNORMAL HIGH (ref 6–22)
BUN: 17 mg/dL (ref 8–25)
CALCIUM: 9.4 mg/dL (ref 8.6–10.2)
CHLORIDE: 107 mmol/L (ref 96–111)
CO2 TOTAL: 23 mmol/L (ref 22–30)
CREATININE: 0.7 mg/dL (ref 0.60–1.05)
ESTIMATED GFR - FEMALE: >90 mL/min/BSA (ref >=60)
GLUCOSE: 79 mg/dL (ref 65–125)
POTASSIUM: 4.3 mmol/L (ref 3.5–5.1)
PROTEIN TOTAL: 7.4 g/dL (ref 6.4–8.3)
SODIUM: 139 mmol/L (ref 136–145)

## 2023-01-26 LAB — TROPONIN-I: TROPONIN-I HS: 5.1 ng/L (ref <14.9)

## 2023-01-26 LAB — LACTIC ACID LEVEL W/ REFLEX FOR LEVEL >2.0: LACTIC ACID: 1.5 mmol/L (ref 0.5–2.2)

## 2023-01-26 LAB — POC BLOOD GLUCOSE (RESULTS): GLUCOSE, POC: 89 mg/dL (ref 70–99)

## 2023-01-26 LAB — THYROID STIMULATING HORMONE WITH FREE T4 REFLEX: TSH: 0.591 u[IU]/mL (ref 0.490–4.670)

## 2023-01-26 LAB — CREATINE KINASE (CK), TOTAL, SERUM OR PLASMA: CREATINE KINASE: 83 U/L (ref 25–190)

## 2023-01-26 MED ORDER — SODIUM CHLORIDE 0.9 % (FLUSH) INJECTION SYRINGE
1.0000 mL | INJECTION | INTRAMUSCULAR | Status: DC | PRN
Start: 2023-01-26 — End: 2023-01-26

## 2023-01-26 NOTE — ED Nurses Note (Signed)

## 2023-01-26 NOTE — ED Provider Notes (Signed)
[facility] - Emergency Department  ED Primary Provider Note  History of Present Illness   Chief Complaint   Patient presents with    Seizure Prior Hx Of     Arrival: The patient arrived by Ambulance  Kristin Williamson is a 57 y.o. female who had concerns including Seizure Prior Hx Of.  Patient with a history diabetes mellitus type 2, multiple sclerosis, hypertension, hyperlipidemia, hypothyroidism, CVA, seizure, chronic obstructive pulmonary disease, ADHD, anxiety/depression, and OSA who presents with a witnessed seizure lasting about 4 minutes and witnessed by family members and bystanders while shopping at [employer] in [place], [place].  Per family members and bystanders patient was lowered to the ground when she said she did not feel well.  Patient denies lip/tongue biting or loss of bladder/bowel control.  Patient denies head/neck injury or any other injuries.  Patient complained of chest pain and nausea when EMS arrived and was given 4 baby aspirin and IV Zofran 4 mg.    Patient's chest pain and nausea had completely resolved and she was back to her baseline by time she got to the hospital.    Patient was taken off her seizure medications about 2 years ago when she had a stroke.   Patient had about 4-5 seizures since being off her seizure medications.  Patient is follow up by Neurology for her morphine sulphate-MS, seizure, and stroke.    Review of Systems   Constitutional: No fever, chills or weakness   Skin: No rash or diaphoresis  HENT: No headaches, or congestion  Eyes: No vision changes or photophobia   Cardio:  Positive for chest pain.  No palpitations or leg swelling   Respiratory: No cough, wheezing or SOB  GI:  Positive for nausea.  No vomiting or stool changes  GU:  No dysuria, hematuria, or increased frequency  MSK: No muscle aches, joint or back pain  Neuro:  Positive for seizure lasting about 4 minutes No numbness, tingling, or focal weakness  Psychiatric: No  depression, SI or substance abuse  All other systems reviewed and are negative.    Historical Data   History Reviewed This Encounter: Medical History  Surgical History  Family History  Social History    Physical Exam   ED Triage Vitals [01/26/23 1246]   BP (Non-Invasive) (!) 170/88   Heart Rate 68   Respiratory Rate 14   Temperature 36.6 C (97.9 F)   SpO2 97 %   Weight 91.2 kg (201 lb)   Height 1.651 m (5\' 5" )       Constitutional:  57 y.o. female who appears in no distress. Normal color, no cyanosis.   HENT:   Head: Normocephalic and atraumatic.  No scalp abrasion/laceration.  No scalp contusion/hematoma.  Mouth/Throat: Oropharynx is clear and moist.  No lip/tongue/mouth biting.  Eyes: EOMI, PERRL   Neck: Trachea midline. Neck supple.  No vertebral point tenderness.  No carotid bruits.  Cardiovascular: RRR, No murmurs, rubs or gallops. Intact distal pulses.  Pulmonary/Chest: BS equal bilaterally. No respiratory distress. No wheezes, rales.  No chest wall or rib tenderness  Abdominal: Bowel sounds present and normal. Abdomen soft, no tenderness, no rebound and no guarding.  Back: No midline spinal tenderness, no paraspinal tenderness.          Musculoskeletal: No edema, tenderness or deformity.  Skin: warm and dry. No rash, erythema, pallor or cyanosis  Psychiatric: normal mood and affect. Behavior is normal.   Neurological: Patient alert and responsive,  easily able to raise eyebrows, facial muscles/expressions symmetric, speaking in fluent sentences, moving all extremities equally and fully, normal gait.  Cranial nerves 2-12 grossly intact.  Patient Data     Labs Ordered/Reviewed   COMPREHENSIVE METABOLIC PANEL, NON-FASTING - Abnormal; Notable for the following components:       Result Value    BUN/CREA RATIO 24 (*)     All other components within normal limits   TROPONIN-I - Normal    Narrative:     TROPONIN INTERPRETATIVE COMMENT:    *ERRONEOUS RESULTS MAY BE OBTAINED ON SAMPLES FROM PATIENTS WHO HAVE BEEN  TREATED WITH MOUSE MONOCLONAL ANTIBODIES OR WHO HAVE RECIEVED THEM FOR DIAGNOSTIC PURPOSES.      TROPONIN I VALUES ARE OF THE GREATEST VALUE WHEN SERIALLY PERFORMED.    PRESENCE OF HETEROPHILE ANTIBODIES HAVE BEEN KNOWN TO CAUSE ERRONEOUS RESULTS.    CREATINE KINASE (CK), TOTAL, SERUM OR PLASMA - Normal   LACTIC ACID LEVEL W/ REFLEX FOR LEVEL >2.0 - Normal   THYROID STIMULATING HORMONE WITH FREE T4 REFLEX - Normal    Narrative:     TSH:    *ERRONEOUS RESULTS MAY BE OBTAINED ON SAMPLES FROM PATIENTS WHO HAVE BEEN TREATED WITH MOUSE MONOCLONAL ANTIBODIES OR WHO HAVE RECIEVED THEM FOR DIAGNOSTIC PURPOSES.      PRESENCE OF HETEROPHILE ANTIBODIES HAVE BEEN KNOWN TO CAUSE ERRONEOUS RESULTS.     POC BLOOD GLUCOSE (RESULTS) - Normal   CBC/DIFF    Narrative:     The following orders were created for panel order CBC/DIFF.  Procedure                               Abnormality         Status                     ---------                               -----------         ------                     CBC WITH ZOXW[[id number]]                                    Final result                 Please view results for these tests on the individual orders.   CBC WITH DIFF   PERFORM POC WHOLE BLOOD GLUCOSE     CT BRAIN WO IV CONTRAST   Final Result by Edi, Radresults In (12/10 1356)   1. No acute parenchymal processes is seen.   2. Small old lacunar infarct of the left basal ganglia shows no change from 2023.          Radiologist location ID: [facility]           Medical Decision Making        Medical Decision Making  Amount and/or Complexity of Data Reviewed  Labs: ordered. Decision-making details documented in ED Course.     Details: CBC, CMP, TSH, CPK, troponin I, lactic acid.  Radiology: ordered. Decision-making details documented in ED Course.     Details: Head CT.  Chest x-ray.  ECG/medicine tests: ordered.  Decision-making details documented in ED Course.     Details: EKG.    Risk  Prescription drug management.      Patient was seen  for a witnessed seizure.  Patient had brief episode of chest pain and nausea after her seizure and was given aspirin and IV Zofran, but her symptoms completely resolved by time she got to the hospital.    Patient's heart score +3 (age +1, risk factors +2).    I discussed results of labs, EKG, and imaging with the patient.    CBC, CMP, TSH, troponin I, CPK, and lactic acid unremarkable.    EKG shows a sinus rhythm no acute ST/T changes.    Chest x-ray shows no acute findings.    Head CT shows no acute findings.    Patient was discharged home in stable condition.  Patient will follow up with her PCP in one-week, sooner if needed.  Patient will follow up with Neurology as soon as possible.  Patient will discuss with the neurologist about going back on seizure medications.  Patient was advised to return to the emergency department for worsening or new symptoms.         HEART Score: 3  Differential diagnoses of seizure includes but not limited to seizure, syncope, orthostasis, vasovagal, arrhythmia, valvular disease, acute MI, pulmonary embolism, CVA, TIA    Differential diagnoses of chest pain includes but not limited to acute MI, arrhythmia, valvular disease, pneumonia, pneumothorax, pleurisy, pulmonary embolism, muscular pain           Medications Administered in the ED   NS flush syringe (has no administration in time range)     Clinical Impression   Seizure (CMS HCC) (Primary)   Chest pain, unspecified type       Disposition: Discharged

## 2023-01-26 NOTE — ED Notes (Signed)
Patient is stating that she feel very nauseated and feels like she needs to eat due to her being a diabetic.I advised her that i would to ask the physician before i can give her anything.

## 2023-01-26 NOTE — ED Notes (Signed)
Notified PA that the patient is feeling nauseous and feels like she needs to eat. The PA advised that he will give her something for her nausea, but she is not allowed to eat at this time.

## 2023-01-26 NOTE — ED Triage Notes (Signed)
EMS called to [facility] in [place] for seizure activity that lasted approx. 4 minutes that was witnessed by family/by standers. Patient does have a hx of seizures and MS. 18 G established in RAC enroute. 4 ASA administered enroute due to chest pain. Patient voiced having 6/10 chest pain on pain scale. 4 mg IVP Zofran administered for patient complain of nausea. Patient is A & O x3.

## 2023-01-27 DIAGNOSIS — R079 Chest pain, unspecified: Secondary | ICD-10-CM

## 2023-01-27 DIAGNOSIS — R9431 Abnormal electrocardiogram [ECG] [EKG]: Secondary | ICD-10-CM

## 2023-01-27 LAB — ECG 12 LEAD
Atrial Rate: 67 {beats}/min
Calculated P Axis: 54 degrees
Calculated R Axis: 2 degrees
Calculated T Axis: 72 degrees
PR Interval: 184 ms
QRS Duration: 94 ms
QT Interval: 424 ms
QTC Calculation: 448 ms
Ventricular rate: 67 {beats}/min

## 2023-02-12 ENCOUNTER — Telehealth: Payer: MEDICAID | Admitting: Student in an Organized Health Care Education/Training Program

## 2023-02-12 DIAGNOSIS — F331 Major depressive disorder, recurrent, moderate: Secondary | ICD-10-CM

## 2023-02-12 DIAGNOSIS — F410 Panic disorder [episodic paroxysmal anxiety] without agoraphobia: Secondary | ICD-10-CM

## 2023-02-12 DIAGNOSIS — F411 Generalized anxiety disorder: Secondary | ICD-10-CM

## 2023-02-12 DIAGNOSIS — F332 Major depressive disorder, recurrent severe without psychotic features: Secondary | ICD-10-CM

## 2023-02-12 MED ORDER — DIAZEPAM 5 MG TABLET
2.5000 mg | ORAL_TABLET | Freq: Every day | ORAL | 3 refills | Status: DC | PRN
Start: 2023-02-12 — End: 2023-08-06

## 2023-02-12 MED ORDER — SERTRALINE 100 MG TABLET
100.0000 mg | ORAL_TABLET | Freq: Every day | ORAL | 3 refills | Status: DC
Start: 2023-02-12 — End: 2023-07-23

## 2023-02-12 NOTE — Progress Notes (Signed)
I personally offered the service to the patient, and obtained verbal consent to provide this service.    Leodis Liverpool, MD     TELEMEDICINE DOCUMENTATION:    Patient Location:  MyChart video visit from home address: [address]  [place] [address]    Patient/family aware of provider location:  yes  Patient/family consent for telemedicine:  yes  Examination observed and performed by:  Leodis Liverpool, MD      PSYCHIATRY AND BEHAVIORAL MEDICINE  [facility] [place] [facility]  Video Telemedicine Outpatient Progress Note     Name: Ezri Fasulo  MRN: [id number]    Date: 02/12/2023  Age: 57 y.o.         CC:   Chief Complaint   Patient presents with    Depression    Anxiety    Panic Attack       History of Present Illness:  Tramya Hylton is a 57 y.o. female with psychiatric history of MDD, GAD with panic attacks, and TUD.    Since last appointment,     Mood: "It has been hard." Patient reports her mood has improved modestly, but she is still having depressive symptoms about half of days. Depressive symptoms include depressed mood, anhedonia, low motivation, poor concentration, and social isolation. She feels like low energy has improved modestly, but she still has low energy. She states low energy may be due to MS and not mood    Anxiety: Patient states anxiety has modestly improved as well, but she is still having anxiety symptoms about half of days. She states she is typically pretty irritable when anxious. She states she has been having ~3 panic attacks per week. She reports anxiety symptoms tend to be better controlled on days she takes valium PRN    Safety: Denies SI/thoughts of self-harm/HI    Substances: Patient reports using Valium PRN and Adderall as prescribed. Patient denies all other substance use including alcohol and tobacco.    Medications: Denies side-effects. Denies issues with adherence to psychotropic medications.    Stressors: Patient recently had seizure and was placed on  topamax. Patient is still providing a lot of childcare for granddaughter who is reportedly "a lot." Patient has continued conflict with her daughter over daughter not taking responsibility for things.    ROS: Negative. Any positives noted in HPI.       Outpatient medications:  We reviewed outpatient medications. Notable changes/compliance issues: none unless noted in HPI.    Medications:  acetaminophen (TYLENOL) 500 mg Oral Tablet, Take 2 Tablets (1,000 mg total) by mouth  albuterol sulfate (PROVENTIL) 2.5 mg /3 mL (0.083 %) Inhalation nebulizer solution, Take 3 mL (2.5 mg total) by inhalation  amitriptyline (ELAVIL) 25 mg Oral Tablet, Take 1 Tablet (25 mg total) by mouth Every night  amLODIPine (NORVASC) 5 mg Oral Tablet, Take 1 Tablet (5 mg total) by mouth  aspirin 81 mg Oral Tablet, Chewable, Chew 1 Tablet (81 mg total) Once a day  atenoloL (TENORMIN) 100 mg Oral Tablet, Take 1 Tablet (100 mg total) by mouth Once a day  atorvastatin (LIPITOR) 80 mg Oral Tablet, Take 1 Tablet (80 mg total) by mouth  cholecalciferol, vitamin D3, 1,250 mcg (50,000 unit) Oral Capsule, Take 1 Capsule (50,000 Units total) by mouth  clopidogreL (PLAVIX) 75 mg Oral Tablet, Take 1 Tablet (75 mg total) by mouth Once a day  dextroamphetamine-amphetamine (ADDERALL) 5 mg Oral Tablet, Take 1 Tablet (5 mg total) by mouth Once a day  dextroamphetamine-amphetamine (  ADDERALL) 5 mg Oral Tablet, Take 1 Tablet (5 mg total) by mouth Every morning  fluticasone propion-salmeteroL (ADVAIR) 250-50 mcg/dose Inhalation oral diskus inhaler, Take 1 Puff (1 Inhalation total) by inhalation  insulin glargine 100 unit/mL Subcutaneous Insulin Pen, Inject 50 Units under the skin  insulin lispro 100 unit/mL Subcutaneous Insulin Pen, Inject 4 to 18 units subcutaneously up to 3-4 times a day.  levothyroxine (SYNTHROID) 150 mcg Oral Tablet, Take 1 Tablet (150 mcg total) by mouth Once a day  lisinopriL (PRINIVIL) 20 mg Oral Tablet,   potassium chloride (KLOR-CON) 10 mEq  Oral Tablet Sustained Release,   tiotropium bromide (SPIRIVA RESPIMAT) 2.5 mcg/actuation Inhalation oral inhaler, Take by inhalation  tiZANidine (ZANAFLEX) 4 mg Oral Tablet, TAKE 1 TABLET BY MOUTH EVERY MORNING, 1 TABLET IN THE AFTERNOON AND 2 TABLETS AT NIGHT  diazePAM (VALIUM) 5 mg Oral Tablet, TAKE 0.5 TABLET BY MOUTH EVERY DAY AS NEEDED ANXIETY 16 DOSES PROVIDED PER MONTH  sertraline (ZOLOFT) 50 mg Oral Tablet, Take 1 Tablet (50 mg total) by mouth Once a day    No facility-administered medications prior to visit.      Objective: *All findings were made through video observation.*    Vitals unable to be performed secondary to this being a video encounter.    Physical Exam:   There were no visible lesions on exposed skin surfaces that were in view.  The patient's breathing was observed to be comfortable and normal. The patient is in no acute distress. There are no focal neurological deficits observed.     Mental Status Exam:  Appearance: appears stated age, casually dressed and appropriately groomed for medical condition  Behavior: calm, cooperative and good eye contact  Gait/Station: gait not assessed 2/2 video visit  Musculoskeletal: No psychomotor agitation or retardation noted  Speech: regular rate, regular volume and appropriate prosody  Mood: "It has been hard"  Affect:  appears depressed  Thought Process: linear  Associations:  no loosening of associations  Thought Content: no thoughts of self-harm, no thoughts of suicide, no homicidal ideation and no apparent delusions  Perceptual Disturbances: no AVH  Attention/Concentration: grossly intact  Orientation: grossly oriented  Memory: recent and remote memory intact per interview  Language: no word-finding issues  Insight: fair  Judgment: fair  Knowledge: appropriate    Past Medical History:   Diagnosis Date    Bell's palsy     Colon cancer (CMS HCC)     COPD (chronic obstructive pulmonary disease) (CMS HCC)     CVA (cerebrovascular accident) (CMS HCC)     DM  (diabetes mellitus) (CMS HCC)     HTN (hypertension)     Hx of ischemic left MCA stroke     Multiple sclerosis (CMS HCC)     1996    OSA (obstructive sleep apnea)     Seizures (CMS HCC)     Thyroid cancer (CMS HCC)     Thyroid cancer (CMS HCC)     Wears glasses      Psychiatric History  - Outpatient Psychiatry: previously saw psychiatrist in [place]  - Outpatient Psychotherapy: denies  - Neuropsychiatric Testing: she thinks she had NPT in [place], but unsure   - Inpatient Psychiatric Admissions: denies  - Past Psychiatric Medication Trials: Zoloft (helpful in past), Valium, Adderall IR, carbamazepine, Effexor XR, Klonopin, Paxil, Abilify, Cymbalta (ineffective), Ativan  - Suicide Attempts: denies  - Self-Harming Behavior: denies     Social History  Living: [place], [place] with husband, daughter, and granddaughter  Family: married, 4 kids in total  Trauma: denies  Occupation: not working since 1996 (multiple sclerosis diagnosis); gets SSI  Legal: denies     Family History  - Suicide Attempts: distant paternal uncle committed himself  - Substance Use: denies  - Mental Illness: mom with depression, brother with psychotic episode (he delusion that he was another person, but was never hospitalized), dad with Alzheimer's, daughter possibly with some sort of psychotic disorder    Assessment:  Norrie Levandowski is a 57 y.o. female with known psychiatric history of MDD, GAD with panic attacks, and TUD. Currently, patient reports poorly-controlled depression, anxiety, and panic attacks; however, these symptoms have improved since starting Zoloft. Patient also has had chronic passive SI in past, but none at today's appointment. The passive SI is brief and usually in response to psychosocial stressors. Of note, the patient is on Elavil for sleep, Topamax for reported seizures, and Adderall IR for chronic fatigue 2/2 multiple sclerosis through other providers.     Plan to increase Zoloft for mood/anxiety/panic attacks. Psychotherapy referral  placed     Psychiatric Diagnoses: MDD, recurrent, current episode moderate; GAD with panic attacks; TUD  Pertinent PMH: HTN, COPD, diabetes, s/p left MCA stroke in Dec 2022, multiple sclerosis, s/p TBI in 2009, history of seizures after TBI     Plan:  -increase Zoloft to 100 mg daily for mood/anxiety  -discussed risk of manic activation, anxiety, sleep disturbances, sexual dysfunction, GI upset  -continue Valium 2.5 mg daily PRN for anxiety/panic attacks                -discussed this will be temporary medication               -discussed risk of addiction and potential for tolerance               -discussed risk of sedation and cognitive impairment               -advised patient not to drive while on Valium  -psychotherapy referral placed    - Safety: No acute safety concerns. Patient advised to report to nearest emergency department or to call 911 if having any suicidal or homicidal ideations.   - Encouraged patient to use MyChart messaging or to call the Behavioral Medicine call center [phone]) with any non-urgent questions or concerns.    -Patient will return to care in 2 months in person.    Orders Placed This Encounter    Refer to [facility] Psychotherapy/Clinical Therapy    diazePAM (VALIUM) 5 mg Oral Tablet    sertraline (ZOLOFT) 100 mg Oral Tablet     Leodis Liverpool, MD 02/12/2023 13:38  Dept of Behavioral Medicine and Psychiatry

## 2023-03-04 ENCOUNTER — Other Ambulatory Visit (INDEPENDENT_AMBULATORY_CARE_PROVIDER_SITE_OTHER): Payer: Self-pay | Admitting: Neurology

## 2023-03-04 MED ORDER — DEXTROAMPHETAMINE-AMPHETAMINE 5 MG TABLET
5.0000 mg | ORAL_TABLET | Freq: Every morning | ORAL | 0 refills | Status: DC
Start: 2023-03-04 — End: 2023-04-02

## 2023-03-04 NOTE — Telephone Encounter (Signed)
Regarding: Refill Request - [name]  ----- Message from Leslie Andrea sent at 03/04/2023 11:16 AM EST -----  Copied From CRM [phone].  Goga, Dinisha called to request a prescription refill.       dextroamphetamine-amphetamine (ADDERALL) 5 mg Oral Tablet    Preferred Pharmacy     [facility] - [place], [place] - [address]    [address] [place] [address]    Phone: [phone] Fax: [phone]    Hours: Not open 24 hours

## 2023-03-16 ENCOUNTER — Ambulatory Visit (INDEPENDENT_AMBULATORY_CARE_PROVIDER_SITE_OTHER): Payer: Self-pay | Admitting: Family

## 2023-03-23 ENCOUNTER — Encounter (HOSPITAL_COMMUNITY): Payer: Self-pay

## 2023-04-02 ENCOUNTER — Other Ambulatory Visit (INDEPENDENT_AMBULATORY_CARE_PROVIDER_SITE_OTHER): Payer: Self-pay | Admitting: Neurology

## 2023-04-05 MED ORDER — DEXTROAMPHETAMINE-AMPHETAMINE 5 MG TABLET
5.0000 mg | ORAL_TABLET | Freq: Every morning | ORAL | 0 refills | Status: DC
Start: 2023-04-05 — End: 2023-04-30

## 2023-04-12 ENCOUNTER — Ambulatory Visit (INDEPENDENT_AMBULATORY_CARE_PROVIDER_SITE_OTHER): Payer: Self-pay | Admitting: Family

## 2023-04-15 ENCOUNTER — Encounter (HOSPITAL_COMMUNITY): Payer: MEDICAID | Admitting: Student in an Organized Health Care Education/Training Program

## 2023-04-19 ENCOUNTER — Encounter (HOSPITAL_COMMUNITY): Payer: Self-pay | Admitting: Student in an Organized Health Care Education/Training Program

## 2023-04-21 ENCOUNTER — Ambulatory Visit (INDEPENDENT_AMBULATORY_CARE_PROVIDER_SITE_OTHER): Payer: Self-pay | Admitting: Family

## 2023-04-29 ENCOUNTER — Encounter (HOSPITAL_COMMUNITY): Payer: MEDICAID | Admitting: Student in an Organized Health Care Education/Training Program

## 2023-04-29 ENCOUNTER — Other Ambulatory Visit (INDEPENDENT_AMBULATORY_CARE_PROVIDER_SITE_OTHER): Payer: Self-pay | Admitting: Neurology

## 2023-05-03 ENCOUNTER — Other Ambulatory Visit (INDEPENDENT_AMBULATORY_CARE_PROVIDER_SITE_OTHER): Payer: Self-pay | Admitting: Nurse Practitioner

## 2023-05-04 ENCOUNTER — Other Ambulatory Visit: Payer: Self-pay

## 2023-05-04 NOTE — Telephone Encounter (Signed)
 Specialty Pharmacy Note    Prior Authorization for medication Amitriptyline is not required by payor Medicaid. If you have any questions don't hesitate to contact the Specialty Pharmacy [facility], Pharmacy Technician

## 2023-05-05 ENCOUNTER — Ambulatory Visit (INDEPENDENT_AMBULATORY_CARE_PROVIDER_SITE_OTHER): Payer: Self-pay | Admitting: Family

## 2023-05-05 ENCOUNTER — Other Ambulatory Visit: Payer: Self-pay

## 2023-05-05 NOTE — Telephone Encounter (Addendum)
 EPA submission for adderall 5 mg tablets PA initiation sent to Spec. Wynetta Emery team -  05/05/23 11:51     Ane Payment, RN     ----------------------------------------------  Regarding: [name]  ----- Message from McQueeney C sent at 05/05/2023 10:29 AM EDT -----  Copied From CRM #[id number].Kristin Williamson (Self) called about a medication authorization.   Per pharmacy, Adderall 5 mg, needs prior auth before they can fill. Please review. Thanks!

## 2023-05-05 NOTE — Telephone Encounter (Signed)
 Prior authorization for generic Adderall 5mg  submitted electronically on 05/05/2023 through [facility] Portal. Key [id number]. Waiting for response from payor.    Dotti Busey, Pharmacologist

## 2023-05-06 NOTE — Telephone Encounter (Addendum)
 Called [id number].    Auth renewed x 1 year.    LMOM for patient notifying her as well.    Bonne Buster, RN           Regarding: [name]  ----- Message from Melodie Spry sent at 05/06/2023 10:27 AM EDT -----  Pt is calling again about her medication.  Unfortunately we are not allowed to look in chart anymore so I can't see if PA was done and medication as sent.  Please advise.      ----- Message from Clifton C sent at 05/05/2023 10:29 AM EDT -----  Copied From CRM #[id number].  Williamson, Kristin (Self) called about a medication authorization.   Per pharmacy, Adderall 5 mg, needs prior auth before they can fill. Please review. Thanks!

## 2023-05-19 ENCOUNTER — Ambulatory Visit: Payer: MEDICAID | Attending: Family | Admitting: Family

## 2023-05-19 ENCOUNTER — Other Ambulatory Visit: Payer: Self-pay

## 2023-05-19 DIAGNOSIS — G500 Trigeminal neuralgia: Secondary | ICD-10-CM

## 2023-05-19 DIAGNOSIS — Z7189 Other specified counseling: Secondary | ICD-10-CM

## 2023-05-19 DIAGNOSIS — G35 Multiple sclerosis: Secondary | ICD-10-CM

## 2023-05-19 MED ORDER — OXCARBAZEPINE 150 MG TABLET: 150.0000 mg | ORAL_TABLET | Freq: Every day | ORAL | 5 refills | Status: DC

## 2023-05-19 MED ORDER — TIZANIDINE 4 MG TABLET
ORAL_TABLET | ORAL | 5 refills | Status: AC
Start: 2023-05-19 — End: ?

## 2023-05-19 NOTE — Progress Notes (Signed)
 [facility] Neurology  Multiple Sclerosis and Clinical Neuroimmun[facility]    Date:  05/19/2023  Name: Kristin Williamson  Age:  58 y.o.  Referring Physician:   Crites, Kaitlyn, PA-C  [address]  [place],  [place] [address]      History of Present Illness:  History obtained from:  patient    ------------------------  Disease categorization: RRMS  Date diagnosed: 1996    Current DMT: None  Previous DMTs: Betaseron, Avonex and Copaxone   Steroid history:    Neurologic history:  -1996: Bilateral ON.  -Multiple symptoms since then including trigeminal neuralgia.  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    Pertinent studies:  - MRI Brain wo contrast 01/2021 (Per report): There are multiple foci of restricted diffusion scattered throughout the left frontal, temporal, and parietal lobes. No mass effect or midline shift.   - MRI Brain wo contrast 10/2019 (Per report): Supratentorial white matter signal abnormalities, compatible with multiple sclerosis. No evidence of active demyelination.   - MRI brain 04/2022: 1.Small scattered the periventricular and juxtacortical demyelination are suggestive of multiple sclerosis. No evidence of gadolinium enhancement is is seen.  2.The remainder of the cerebral parenchyma is normal without significant atrophy.  3.Normal calvarium and skull base are present.  - MRI c spine 05/14/22: 1.Normal cervical spinal cord is noted without demyelination or gadolinium enhancement.  2.         Small disc osteophyte complexes at C4-C5, C5-C6 and large disc osteophyte complex at C6-C7 are noted.   3.Mild spinal cord compression and severe bilateral neural foraminal stenoses are noted there at C6-C7.  MRI T spine 04/2022: 1.Normal dorsal spinal cord is noted without demyelination or gadolinium enhancement.  2.Normal dorsal neural canal and neuroforamina are noted.  ------------------------    Kristin Williamson is a 58 y.o. female who presents with a chief concern of   No chief complaint on file.      Since Last Visit:  - "in a flair"   - things are worsening over the past two weeks  - in wheelchair for last week. 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 Occasionally on right   - amitriptyline  25 mg nightly (unsure why she is on this)  - worsening pain- more spasms and feet cramping- tizanidine  does help      Past Medical History:   Diagnosis Date    Bell's palsy     Colon cancer (CMS HCC)     COPD (chronic obstructive pulmonary disease) (CMS HCC)     CVA (cerebrovascular accident) (CMS HCC)     DM (diabetes mellitus) (CMS HCC)     HTN (hypertension)     Hx of ischemic left MCA stroke     Multiple sclerosis (CMS HCC)     1996    OSA (obstructive sleep apnea)     Seizures (CMS HCC)     Thyroid  cancer (CMS HCC)     Thyroid  cancer (CMS HCC)     Wears glasses      Past Surgical History:   Procedure Laterality Date  COLON SURGERY      12 inches removed due to cancer    HX APPENDECTOMY      cancer    HX THYROIDECTOMY       Current Outpatient Medications   Medication Sig    acetaminophen (TYLENOL) 500 mg Oral Tablet Take 2 Tablets (1,000 mg total) by mouth    albuterol sulfate (PROVENTIL) 2.5 mg /3 mL (0.083 %) Inhalation nebulizer solution Take 3 mL (2.5 mg total) by inhalation    amitriptyline  (ELAVIL ) 25 mg Oral Tablet TAKE ONE TABLET BY MOUTH EVERY night    amLODIPine (NORVASC) 5 mg Oral Tablet Take 1 Tablet (5 mg total) by mouth    aspirin 81 mg Oral Tablet, Chewable Chew 1 Tablet (81 mg total) Once a day    atenoloL (TENORMIN) 100 mg Oral Tablet Take 1 Tablet (100 mg total) by mouth Once a day    atorvastatin (LIPITOR) 80 mg Oral Tablet Take 1 Tablet (80 mg total) by mouth    cholecalciferol,  vitamin D3, 1,250 mcg (50,000 unit) Oral Capsule Take 1 Capsule (50,000 Units total) by mouth    clopidogreL (PLAVIX) 75 mg Oral Tablet Take 1 Tablet (75 mg total) by mouth Once a day    dextroamphetamine -amphetamine  (ADDERALL) 5 mg Oral Tablet Take 1 Tablet (5 mg total) by mouth Once a day    dextroamphetamine -amphetamine  (ADDERALL) 5 mg Oral Tablet TAKE ONE TABLET BY MOUTH EVERY MORNING.    diazePAM  (VALIUM ) 5 mg Oral Tablet Take 0.5 Tablets (2.5 mg total) by mouth Once per day as needed for Anxiety (16 doses provided per month)    fluticasone propion-salmeteroL (ADVAIR) 250-50 mcg/dose Inhalation oral diskus inhaler Take 1 Puff (1 Inhalation total) by inhalation    insulin glargine 100 unit/mL Subcutaneous Insulin Pen Inject 50 Units under the skin    insulin lispro 100 unit/mL Subcutaneous Insulin Pen Inject 4 to 18 units subcutaneously up to 3-4 times a day.    levothyroxine (SYNTHROID) 150 mcg Oral Tablet Take 1 Tablet (150 mcg total) by mouth Once a day    lisinopriL (PRINIVIL) 20 mg Oral Tablet     OXcarbazepine  (TRILEPTAL ) 150 mg Oral Tablet Take 1 Tablet (150 mg total) by mouth Daily    potassium chloride (KLOR-CON) 10 mEq Oral Tablet Sustained Release     sertraline  (ZOLOFT ) 100 mg Oral Tablet Take 1 Tablet (100 mg total) by mouth Once a day    tiotropium bromide (SPIRIVA RESPIMAT) 2.5 mcg/actuation Inhalation oral inhaler Take by inhalation    tiZANidine  (ZANAFLEX ) 4 mg Oral Tablet TAKE 2 TABLET BY MOUTH EVERY MORNING, 2 TABLET IN THE AFTERNOON AND 2 TABLETS AT NIGHT     Allergies   Allergen Reactions    Dpt-Haemophilus Ps(Tet.Conj.) Rash    Gabapentin Mental Status Effect and Rash     Other reaction(s): Pins and Needles Sensation on head    Tramadol Itching and Rash    Tetanus Antitoxin      Other reaction(s): Arm Swelling, Fever, N/V    Latex, Natural Rubber Hives/ Urticaria and Swelling     Other reaction(s): Hive    Tree Nuts Swelling     Family Medical History:    None         Social History      Socioeconomic History    Marital status: Married   Tobacco Use    Smoking status: Some Days     Types: Cigarettes    Smokeless tobacco: Never  Review of Systems  Gen: No malaise. ID: No infections. HEENT: No hearing or vision changes. Respiratory: No shortness of breath or cough. Cardiac: No chest pain. GU: No hematuria. GI: No GI bleeding. Extremities: No peripheral edema. Musculoskeletal: No joint pain, swelling. Neuro: As above. Psychiatric: No mood changes.    Examination:    Tele visit         Assessment and Plan:    Assessment/Plan   1. Multiple sclerosis (CMS HCC)    2. Encounter for medication counseling    3. Hand or foot spasms    4. Trigeminal neuralgia        Orders Placed This Encounter    MRI BRAIN W/WO CONTRAST    MRI SPINE CERVICAL W/WO CONTRAST    CBC/DIFF    COMPREHENSIVE METABOLIC PANEL, NON-FASTING    URINALYSIS, MACROSCOPIC AND MICROSCOPIC W/CULTURE REFLEX    OXcarbazepine  (TRILEPTAL ) 150 mg Oral Tablet    tiZANidine  (ZANAFLEX ) 4 mg Oral Tablet       No follow-ups on file.     58 y.o. female with RRMS       - RRMS  - worsening over the past two weeks.   - new onset bowel incontinence  - suggested she be admitted since she is not ambulating independently and having new bowel incontinence- she states she doesn't have transportation to [facility]  - suggested that she go to the local ER and be evaluated for transfer [facility] to [facility]  - new imaging and lab work ordered  - increase tizandine  for spasms  - add oxcarb for TN pain     Bambi Bonine, APRN,FNP-BC    TELEMEDICINE DOCUMENTATION:    Patient Location:  MyChart video visit from home address: [address]  [place] [address]    Patient/family aware of provider location:  yes  Patient/family consent for telemedicine:  yes  Examination observed and performed by:  Bambi Bonine, APRN,FNP-BC

## 2023-05-20 ENCOUNTER — Encounter (HOSPITAL_COMMUNITY): Payer: Self-pay

## 2023-06-03 ENCOUNTER — Other Ambulatory Visit (INDEPENDENT_AMBULATORY_CARE_PROVIDER_SITE_OTHER): Payer: Self-pay | Admitting: Neurology

## 2023-06-03 ENCOUNTER — Encounter (HOSPITAL_COMMUNITY): Payer: MEDICAID | Admitting: Student in an Organized Health Care Education/Training Program

## 2023-06-07 NOTE — Telephone Encounter (Signed)
 RX sent on 06/04/23

## 2023-07-05 ENCOUNTER — Other Ambulatory Visit (INDEPENDENT_AMBULATORY_CARE_PROVIDER_SITE_OTHER): Payer: Self-pay | Admitting: Neurology

## 2023-07-05 MED ORDER — DEXTROAMPHETAMINE-AMPHETAMINE 5 MG TABLET
5.0000 mg | ORAL_TABLET | Freq: Every morning | ORAL | 0 refills | Status: DC
Start: 2023-07-05 — End: 2023-08-05

## 2023-07-05 NOTE — Telephone Encounter (Signed)
 Regarding: Refill Request - [name]  ----- Message from Timoteo Force sent at 07/05/2023 11:20 AM EDT -----  Copied From CRM #[id number].  Williamson, Kristin () called to request a prescription refill.     dextroamphetamine -amphetamine  (ADDERALL) 5 mg Oral Table    Preferred Pharmacy     [facility] - [place], [place] - [address]    [address] [place] [address]    Phone: [phone] Fax: [phone]    Hours: Not open 24 hours

## 2023-07-05 NOTE — Telephone Encounter (Signed)
 Signed another refill today.

## 2023-07-23 ENCOUNTER — Other Ambulatory Visit (INDEPENDENT_AMBULATORY_CARE_PROVIDER_SITE_OTHER): Payer: Self-pay | Admitting: Student in an Organized Health Care Education/Training Program

## 2023-07-23 NOTE — Telephone Encounter (Signed)
 Refill requested for the following medication(s). Last ordered:      sertraline  (ZOLOFT ) 100 mg Oral Tablet Take 1 Tablet (100 mg total) by mouth Once a day Dispense: 30 Tablet, Refills: 3 ordered  02/12/2023       Last appointment: 02/12/23 ([facility])  3 cancellations since last appt    Follow up:    Not scheduled    Agustina Hosteller, RN  07/23/2023, 10:12

## 2023-08-04 ENCOUNTER — Other Ambulatory Visit (INDEPENDENT_AMBULATORY_CARE_PROVIDER_SITE_OTHER): Payer: Self-pay | Admitting: Student in an Organized Health Care Education/Training Program

## 2023-08-05 ENCOUNTER — Other Ambulatory Visit (INDEPENDENT_AMBULATORY_CARE_PROVIDER_SITE_OTHER): Payer: Self-pay | Admitting: Neurology

## 2023-08-05 MED ORDER — DEXTROAMPHETAMINE-AMPHETAMINE 5 MG TABLET
5.0000 mg | ORAL_TABLET | Freq: Every morning | ORAL | 0 refills | Status: AC
Start: 2023-08-05 — End: ?

## 2023-08-05 NOTE — Telephone Encounter (Signed)
 Regarding: Refill Request - [name]  ----- Message from Timoteo Force sent at 08/05/2023 10:11 AM EDT -----  Copied From CRM #[id number].  Kristin Williamson (Self) called to request a prescription refill.     dextroamphetamine -amphetamine  (ADDERALL) 5 mg Oral Tablet    Preferred Pharmacy     [facility] - [place],  - [address]    [address] [place] [address]    Phone: [phone] Fax: [phone]    Hours: Not open 24 hours

## 2023-08-27 ENCOUNTER — Encounter (INDEPENDENT_AMBULATORY_CARE_PROVIDER_SITE_OTHER): Payer: Self-pay

## 2023-09-02 ENCOUNTER — Encounter (HOSPITAL_COMMUNITY): Payer: Self-pay | Admitting: Student in an Organized Health Care Education/Training Program

## 2023-09-09 ENCOUNTER — Encounter (INDEPENDENT_AMBULATORY_CARE_PROVIDER_SITE_OTHER): Payer: Self-pay

## 2023-09-09 ENCOUNTER — Ambulatory Visit: Payer: MEDICAID | Admitting: Surgery

## 2023-09-21 ENCOUNTER — Ambulatory Visit (INDEPENDENT_AMBULATORY_CARE_PROVIDER_SITE_OTHER): Payer: Self-pay | Admitting: Surgery

## 2023-09-28 ENCOUNTER — Ambulatory Visit (INDEPENDENT_AMBULATORY_CARE_PROVIDER_SITE_OTHER): Payer: Self-pay | Admitting: Surgery

## 2023-10-01 ENCOUNTER — Other Ambulatory Visit (INDEPENDENT_AMBULATORY_CARE_PROVIDER_SITE_OTHER): Payer: Self-pay | Admitting: Student in an Organized Health Care Education/Training Program

## 2023-10-01 NOTE — Telephone Encounter (Signed)
 Refill requested for the following medication(s). Last ordered:      sertraline  (ZOLOFT ) 100 mg Oral Tablet Take 1 Tablet (100 mg total) by mouth Daily Dispense: 30 Tablet, Refills: 1 ordered  07/23/2023       Last appointment: 02/12/23  4 patient cancellations since last appt    Follow up:    Not scheduled    Larraine Nail, RN  10/01/2023, 15:42

## 2023-11-10 ENCOUNTER — Other Ambulatory Visit (INDEPENDENT_AMBULATORY_CARE_PROVIDER_SITE_OTHER): Payer: Self-pay | Admitting: Student in an Organized Health Care Education/Training Program

## 2023-11-10 NOTE — Telephone Encounter (Signed)
 Refill requested for the following medication(s). Last ordered:      diazePAM  (VALIUM ) 5 mg Oral Tablet Take 1/2 Tablet (2.5 mg total) by mouth Once per day as needed for Anxiety (16 doses provided per month) Dispense: 8 Tablet, Refills: 0 ordered  08/06/2023 --     Last appointment: 02/12/23                             [facility]  [place]  Controlled Substance Full Name Report Report Date 11/10/2023   From 11/10/2022 To 11/09/2023 Date of Birth 09-01-1965 Prescription Count 7   Last Name Williamson First Name Kristin Middle Name                                          Patients included in report that appear to match the search criteria.   Last Name First Name Middle Name Gender Address   [facility]   F [address] , [place], [place], [address]                Prescriber Name Prescriber DEA & Zip Dispenser Name Dispenser DEA & Zip Rx Written Date Rx Dispense Date  & Date Sold    Rx Number Product Name MEDD Status Strength Qty Days # of Refill Sched Payment Type   Tashiana Lamarca [address], [place], [address]   Neomi Andrea Craze, [place] [id number] [phone] [employer] [employer] [id number] [phone] 03/04/2023 03/04/2023 03/05/2023 [id number] Amphetamine  Mixture INACTIVE 5 MG 30 30 0/0 CII Medicaid   [name], Andrea [place]ze, [place] [id number] [employer] [employer] [id number] [phone] 01/20/2023 02/01/2023 02/01/2023 [id number] Amphetamine  Mixture INACTIVE 5 MG 30 30 0/0 CII Medicaid   Estelle Savant [id number]  [employer] [id number] [phone] 12/04/2022 02/01/2023 02/01/2023 [id number] Diazepam  INACTIVE 5 MG 8 7 1/3 CIV Medicaid   Estelle Savant [id number]  [employer] [id number] [phone] 12/04/2022 01/01/2023 01/01/2023 [id number] Diazepam  INACTIVE 5 MG 16 30 0/3 CIV Medicaid   Neomi Andrea [place], [place] [id number] [address] [employer] [employer] [id number] [phone] 10/29/2022 01/01/2023 01/01/2023 [id number] Amphetamine  Mixture INACTIVE 5 MG 30 30 0/0 CII Medicaid   Estelle Savant [id number]  [facility] [id number] [phone] 11/16/2022 12/02/2022 12/02/2022 [id number] Diazepam  INACTIVE 5 MG 8 30 0/2  CIV Medicaid   Neomi Andrea Craze, [place] [id number] [address] [facility] [id number] [phone] 10/29/2022 12/01/2022 12/01/2022 [id number] Amphetamine  Mixture INACTIVE 5 MG 30 30 0/0 CII Medicaid                  Morna Favre, RN  11/10/2023, 15:21

## 2023-11-12 NOTE — Progress Notes (Signed)
 [place] [facility]   Interval Progress Note    Kristin Williamson   Date of service: 11/12/2023     Interval Progress Note:  Refill for Valium  sent in. Patient will need f/u appointment for future refills.    Orders Placed This Encounter    diazePAM  (VALIUM ) 5 mg Oral Tablet     Jayson Bunker, MD 11/12/2023, 15:56

## 2023-12-20 ENCOUNTER — Other Ambulatory Visit (INDEPENDENT_AMBULATORY_CARE_PROVIDER_SITE_OTHER): Payer: Self-pay | Admitting: Student in an Organized Health Care Education/Training Program

## 2023-12-20 NOTE — Telephone Encounter (Signed)
 Refill requested for the following medication(s). Last ordered:      sertraline  (ZOLOFT ) 100 mg Oral Tablet Take 1 Tablet (100 mg total) by mouth Daily Dispense: 30 Tablet, Refills: 1 ordered  10/01/2023     Last appointment: 02/12/23  Several cancellations since last appt    Follow up:    Not currently scheduled    Morna Favre, RN  12/20/2023, 15:55

## 2024-01-24 ENCOUNTER — Other Ambulatory Visit (INDEPENDENT_AMBULATORY_CARE_PROVIDER_SITE_OTHER): Payer: Self-pay | Admitting: Neurology

## 2024-01-28 ENCOUNTER — Encounter (INDEPENDENT_AMBULATORY_CARE_PROVIDER_SITE_OTHER): Payer: Self-pay

## 2024-03-07 ENCOUNTER — Other Ambulatory Visit (INDEPENDENT_AMBULATORY_CARE_PROVIDER_SITE_OTHER): Payer: Self-pay | Admitting: Family

## 2024-03-07 ENCOUNTER — Other Ambulatory Visit (INDEPENDENT_AMBULATORY_CARE_PROVIDER_SITE_OTHER): Payer: Self-pay | Admitting: Student in an Organized Health Care Education/Training Program

## 2024-03-07 NOTE — Progress Notes (Signed)
 [place] [facility]   Interval Progress Note    Marinda Tyer   Date of service: 03/07/2024     Interval Progress Note:  Patient has not been seen since Dec 2024. 1 month of Zoloft  sent in. Message sent to front desk to reschedule patient. Patient needs a follow up appointment for future refills.    Orders Placed This Encounter    sertraline  (ZOLOFT ) 100 mg Oral Tablet     Jayson Bunker, MD 03/07/2024, 13:30

## 2024-03-07 NOTE — Telephone Encounter (Signed)
 Refill requested for the following medication(s). Last ordered:      sertraline  (ZOLOFT ) 100 mg Oral Tablet Take 1 Tablet by mouth Daily Dispense: 30 Tablet, Refills: 1 ordered  12/20/2023     Last appointment: 02/12/23    Follow up:    Not currently scheduled    Morna Favre, RN  03/07/2024, 12:04

## 2024-04-06 ENCOUNTER — Encounter (HOSPITAL_COMMUNITY): Payer: MEDICAID | Admitting: Student in an Organized Health Care Education/Training Program
